# Patient Record
Sex: Female | Born: 1945 | Race: White | Hispanic: No | Marital: Married | Smoking: Former smoker
Health system: Southern US, Community
[De-identification: ages and names within clinical notes are randomized; demographics above are authoritative.]

## PROBLEM LIST (undated history)

## (undated) DIAGNOSIS — I471 Supraventricular tachycardia, unspecified: Secondary | ICD-10-CM

## (undated) DIAGNOSIS — I10 Essential (primary) hypertension: Secondary | ICD-10-CM

## (undated) DIAGNOSIS — E119 Type 2 diabetes mellitus without complications: Secondary | ICD-10-CM

## (undated) HISTORY — PX: ABDOMINAL HYSTERECTOMY: SHX81

---

## 2003-06-01 ENCOUNTER — Encounter: Admission: RE | Admit: 2003-06-01 | Discharge: 2003-06-01 | Payer: Self-pay | Admitting: Gynecology

## 2003-07-07 ENCOUNTER — Emergency Department (HOSPITAL_COMMUNITY): Admission: EM | Admit: 2003-07-07 | Discharge: 2003-07-08 | Payer: Self-pay

## 2004-06-07 ENCOUNTER — Encounter: Admission: RE | Admit: 2004-06-07 | Discharge: 2004-06-07 | Payer: Self-pay | Admitting: Gynecology

## 2004-08-12 ENCOUNTER — Emergency Department (HOSPITAL_COMMUNITY): Admission: EM | Admit: 2004-08-12 | Discharge: 2004-08-13 | Payer: Self-pay | Admitting: Emergency Medicine

## 2005-04-15 ENCOUNTER — Encounter: Admission: RE | Admit: 2005-04-15 | Discharge: 2005-07-14 | Payer: Self-pay | Admitting: Family Medicine

## 2009-10-26 ENCOUNTER — Encounter: Admission: RE | Admit: 2009-10-26 | Discharge: 2009-10-26 | Payer: Self-pay

## 2010-10-16 ENCOUNTER — Other Ambulatory Visit: Payer: Self-pay | Admitting: Family Medicine

## 2010-10-16 DIAGNOSIS — Z1231 Encounter for screening mammogram for malignant neoplasm of breast: Secondary | ICD-10-CM

## 2010-11-26 ENCOUNTER — Ambulatory Visit
Admission: RE | Admit: 2010-11-26 | Discharge: 2010-11-26 | Disposition: A | Discharge status: Home / Self Care | Payer: Medicare Other | Source: Ambulatory Visit | Attending: Family Medicine | Admitting: Family Medicine

## 2010-11-26 DIAGNOSIS — Z1231 Encounter for screening mammogram for malignant neoplasm of breast: Secondary | ICD-10-CM

## 2011-08-09 ENCOUNTER — Emergency Department (HOSPITAL_COMMUNITY)
Admission: EM | Admit: 2011-08-09 | Discharge: 2011-08-09 | Disposition: A | Discharge status: Home / Self Care | Payer: Medicare Other | Attending: Emergency Medicine | Admitting: Emergency Medicine

## 2011-08-09 ENCOUNTER — Other Ambulatory Visit: Payer: Self-pay

## 2011-08-09 DIAGNOSIS — I10 Essential (primary) hypertension: Secondary | ICD-10-CM | POA: Insufficient documentation

## 2011-08-09 DIAGNOSIS — R000 Tachycardia, unspecified: Secondary | ICD-10-CM | POA: Diagnosis not present

## 2011-08-09 DIAGNOSIS — R6889 Other general symptoms and signs: Secondary | ICD-10-CM | POA: Diagnosis not present

## 2011-08-09 DIAGNOSIS — R51 Headache: Secondary | ICD-10-CM | POA: Insufficient documentation

## 2011-08-09 LAB — URINALYSIS, ROUTINE W REFLEX MICROSCOPIC
Bilirubin Urine: NEGATIVE
Glucose, UA: NEGATIVE mg/dL
Ketones, ur: 15 mg/dL — AB
Nitrite: NEGATIVE
Protein, ur: 100 mg/dL — AB
Specific Gravity, Urine: 1.019 (ref 1.005–1.030)
Urobilinogen, UA: 0.2 mg/dL (ref 0.0–1.0)
pH: 6 (ref 5.0–8.0)

## 2011-08-09 LAB — DIFFERENTIAL
Basophils Absolute: 0 10*3/uL (ref 0.0–0.1)
Basophils Relative: 0 % (ref 0–1)
Eosinophils Absolute: 0 10*3/uL (ref 0.0–0.7)
Eosinophils Relative: 0 % (ref 0–5)
Lymphocytes Relative: 16 % (ref 12–46)
Lymphs Abs: 1.8 10*3/uL (ref 0.7–4.0)
Monocytes Absolute: 0.5 10*3/uL (ref 0.1–1.0)
Monocytes Relative: 5 % (ref 3–12)
Neutro Abs: 8.5 10*3/uL — ABNORMAL HIGH (ref 1.7–7.7)
Neutrophils Relative %: 78 % — ABNORMAL HIGH (ref 43–77)

## 2011-08-09 LAB — BASIC METABOLIC PANEL
BUN: 15 mg/dL (ref 6–23)
CO2: 28 mEq/L (ref 19–32)
Calcium: 10 mg/dL (ref 8.4–10.5)
Chloride: 98 mEq/L (ref 96–112)
Creatinine, Ser: 0.56 mg/dL (ref 0.50–1.10)
GFR calc Af Amer: >90 mL/min (ref >90)
GFR calc non Af Amer: >90 mL/min (ref >90)
Glucose, Bld: 146 mg/dL — ABNORMAL HIGH (ref 70–99)
Potassium: 4.4 mEq/L (ref 3.5–5.1)
Sodium: 136 mEq/L (ref 135–145)

## 2011-08-09 LAB — CBC
HCT: 45.5 % (ref 36.0–46.0)
Hemoglobin: 15.7 g/dL — ABNORMAL HIGH (ref 12.0–15.0)
MCH: 31.5 pg (ref 26.0–34.0)
MCHC: 34.5 g/dL (ref 30.0–36.0)
MCV: 91.2 fL (ref 78.0–100.0)
Platelets: 243 10*3/uL (ref 150–400)
RBC: 4.99 MIL/uL (ref 3.87–5.11)
RDW: 13.4 % (ref 11.5–15.5)
WBC: 10.9 10*3/uL — ABNORMAL HIGH (ref 4.0–10.5)

## 2011-08-09 LAB — GLUCOSE, CAPILLARY: Glucose-Capillary: 177 mg/dL — ABNORMAL HIGH (ref 70–99)

## 2011-08-09 LAB — URINE MICROSCOPIC-ADD ON

## 2011-08-09 MED ORDER — SODIUM CHLORIDE 0.9 % IV BOLUS (SEPSIS)
1000.0000 mL | Freq: Once | INTRAVENOUS | Status: AC
  Administered 2011-08-09: 1000 mL via INTRAVENOUS

## 2011-08-09 NOTE — ED Notes (Signed)
Per ems pt is from home/ from dr office. Pt alerted and oriented x4, ambulatory. ems reports pt went to dr office because of high blood pressure, pt took an extra 12.5 metoprolol earlier today. Prescribed 50 mg metoprolol daily. At present pt has taken 37.5 mg metoprolol. Pt also takes htcz. Dr gave pt clonodine and then called ems to transport pt to ED.

## 2011-08-09 NOTE — ED Notes (Signed)
Pt needed to urinate. Denies dizziness/ syncope. Pt ambulated to bathroom. Provided urine specimen cup.

## 2011-08-09 NOTE — ED Provider Notes (Signed)
66 year old female had a headache last visit she took her blood pressure and it was high. She took her blood pressure again today and continue to be high. She went to see her doctor who noted a blood pressure 240/140 and gave her a dose of clonidine and sent her to the emergency department. Her headache is now resolved. Blood pressures in the emergency department have been elevated but not critically elevated. Workup does show proteinuria and that is the only sign of any potential end organ effects from her blood pressure. She is taking HCTZ, but does not take it on a daily basis. At this point, I feel that she should go on HCTZ daily. She's instructed to take it at a time when she is free to go to the bathroom frequently for the next several hours, and advised to stay on a salt restricted diet. She will need to follow up with her PCP and she may need additional medication for control of blood pressure.  Dione Booze, MD 08/09/11 2225

## 2011-08-09 NOTE — ED Provider Notes (Signed)
History     CSN: [id number]  Arrival date & time 08/09/11  1191   First MD Initiated Contact with Patient 08/09/11 2005      Chief Complaint  Patient presents with  . Hypertension  . Headache    (Consider location/radiation/quality/duration/timing/severity/associated sxs/prior treatment) HPI Patient has emergency department following this at her doctor's office for elevated blood pressure and a mild headache today.  She states that she did not have any chest pain, shortness of breath, weakness, vomiting, nausea, abdominal pain, or visual changes.  Patient states that she is normally well managed on her current medication regimen.  She states that she has had tachycardia in the past and high cholesterol are her only other medical problems.  She states that her blood pressure was 240 systolically.  She states that this time she is feeling better without any symptoms. No past medical history on file.  No past surgical history on file.  No family history on file.  History  Substance Use Topics  . Smoking status: Not on file  . Smokeless tobacco: Not on file  . Alcohol Use: Not on file    OB History    No data available      Review of Systems All pertinent positives and negatives reviewed in the history of present illness  Allergies  Review of patient's allergies indicates no known allergies.  Home Medications   Current Outpatient Rx  Name Route Sig Dispense Refill  . ASPIRIN 81 MG PO CHEW Oral Chew 81 mg by mouth daily.    Marland Kitchen CALCIUM CARBONATE-VITAMIN D 500-200 MG-UNIT PO TABS Oral Take 1 tablet by mouth daily.    . CO Q 10 PO Oral Take 1 tablet by mouth daily.    . CO Q 10 10 MG PO CAPS Oral Take by mouth.    Marland Kitchen HYDROCHLOROTHIAZIDE 12.5 MG PO CAPS Oral Take 12.5 mg by mouth daily.    Marland Kitchen METOPROLOL SUCCINATE ER 25 MG PO TB24 Oral Take 50 mg by mouth 2 (two) times daily. Take 2 tablets twice daily    . POLYETHYL GLYCOL-PROPYL GLYCOL 0.4-0.3 % OP SOLN Both Eyes Place 2 drops  into both eyes.      BP 155/88  Pulse 84  Temp(Src) 98.1 F (36.7 C) (Oral)  Resp 20  SpO2 96%  Physical Exam  Constitutional: She is oriented to person, place, and time. She appears well-developed and well-nourished. No distress.  HENT:  Head: Normocephalic and atraumatic.  Mouth/Throat: Oropharynx is clear and moist. No oropharyngeal exudate.  Eyes: EOM are normal. Pupils are equal, round, and reactive to light.  Neck: Normal range of motion. Neck supple.  Cardiovascular: Normal rate, regular rhythm and normal heart sounds.  Exam reveals no gallop and no friction rub.   No murmur heard. Pulmonary/Chest: Effort normal and breath sounds normal. No respiratory distress. She has no wheezes. She has no rales.  Abdominal: Soft. Bowel sounds are normal. She exhibits no distension. There is no tenderness. There is no rebound and no guarding.  Neurological: She is alert and oriented to person, place, and time. No cranial nerve deficit. She exhibits normal muscle tone. Coordination normal.  Skin: Skin is warm and dry. No rash noted.  Psychiatric: She has a normal mood and affect. Her behavior is normal.    ED Course  Procedures (including critical care time)  Labs Reviewed  GLUCOSE, CAPILLARY - Abnormal; Notable for the following:    Glucose-Capillary 177 (*)    All other components  within normal limits  URINALYSIS, ROUTINE W REFLEX MICROSCOPIC - Abnormal; Notable for the following:    Hgb urine dipstick TRACE (*)    Ketones, ur 15 (*)    Protein, ur 100 (*)    Leukocytes, UA SMALL (*)    All other components within normal limits  URINE MICROSCOPIC-ADD ON - Abnormal; Notable for the following:    Squamous Epithelial / LPF FEW (*)    Bacteria, UA MANY (*)    All other components within normal limits  BASIC METABOLIC PANEL  CBC  DIFFERENTIAL  URINE CULTURE   Patient is feeling improved on my examination.  Will check basic laboratory testing the patient monitored further here.   Pressure 10 is in the room was noted the 150s systolically over 92.  The patient is nondistressed is showing no signs of hypertensive emergency.        MDM  MDM Reviewed: nursing note and vitals Interpretation: labs and ECG            Carlyle Dolly, PA-C 08/09/11 2246

## 2011-08-09 NOTE — ED Notes (Signed)
ONG:[id number]<WU> Expected date:08/09/11<BR> Expected time: 6:13 PM<BR> Means of arrival:Ambulance<BR> Comments:<BR> EMS 10 GC - headache/htn

## 2011-08-10 NOTE — ED Provider Notes (Signed)
Medical screening examination/treatment/procedure(s) were conducted as a shared visit with non-physician practitioner(s) and myself.  I personally evaluated the patient during the encounter   Chelcy Bolda, MD 08/10/11 0131 

## 2011-08-11 LAB — URINE CULTURE
Colony Count: NO GROWTH
Culture  Setup Time: 201302020148
Culture: NO GROWTH

## 2011-08-13 DIAGNOSIS — I10 Essential (primary) hypertension: Secondary | ICD-10-CM | POA: Diagnosis not present

## 2011-09-10 DIAGNOSIS — Z79899 Other long term (current) drug therapy: Secondary | ICD-10-CM | POA: Diagnosis not present

## 2011-09-10 DIAGNOSIS — I10 Essential (primary) hypertension: Secondary | ICD-10-CM | POA: Diagnosis not present

## 2011-09-10 DIAGNOSIS — H6690 Otitis media, unspecified, unspecified ear: Secondary | ICD-10-CM | POA: Diagnosis not present

## 2011-09-10 DIAGNOSIS — J069 Acute upper respiratory infection, unspecified: Secondary | ICD-10-CM | POA: Diagnosis not present

## 2011-09-24 DIAGNOSIS — I10 Essential (primary) hypertension: Secondary | ICD-10-CM | POA: Diagnosis not present

## 2011-09-24 DIAGNOSIS — F411 Generalized anxiety disorder: Secondary | ICD-10-CM | POA: Diagnosis not present

## 2011-09-24 DIAGNOSIS — Z79899 Other long term (current) drug therapy: Secondary | ICD-10-CM | POA: Diagnosis not present

## 2011-10-07 DIAGNOSIS — R319 Hematuria, unspecified: Secondary | ICD-10-CM | POA: Diagnosis not present

## 2011-10-07 DIAGNOSIS — I10 Essential (primary) hypertension: Secondary | ICD-10-CM | POA: Diagnosis not present

## 2011-10-07 DIAGNOSIS — Z79899 Other long term (current) drug therapy: Secondary | ICD-10-CM | POA: Diagnosis not present

## 2011-12-19 ENCOUNTER — Other Ambulatory Visit: Payer: Self-pay | Admitting: Family Medicine

## 2011-12-19 DIAGNOSIS — Z1231 Encounter for screening mammogram for malignant neoplasm of breast: Secondary | ICD-10-CM

## 2011-12-27 ENCOUNTER — Ambulatory Visit
Admission: RE | Admit: 2011-12-27 | Discharge: 2011-12-27 | Disposition: A | Discharge status: Home / Self Care | Payer: No Typology Code available for payment source | Source: Ambulatory Visit | Attending: Family Medicine | Admitting: Family Medicine

## 2011-12-27 DIAGNOSIS — Z1231 Encounter for screening mammogram for malignant neoplasm of breast: Secondary | ICD-10-CM | POA: Diagnosis not present

## 2013-03-29 DIAGNOSIS — H2510 Age-related nuclear cataract, unspecified eye: Secondary | ICD-10-CM | POA: Diagnosis not present

## 2013-05-05 DIAGNOSIS — H2510 Age-related nuclear cataract, unspecified eye: Secondary | ICD-10-CM | POA: Diagnosis not present

## 2014-08-11 DIAGNOSIS — I10 Essential (primary) hypertension: Secondary | ICD-10-CM | POA: Diagnosis not present

## 2014-08-11 DIAGNOSIS — G43909 Migraine, unspecified, not intractable, without status migrainosus: Secondary | ICD-10-CM | POA: Diagnosis not present

## 2014-08-11 DIAGNOSIS — E119 Type 2 diabetes mellitus without complications: Secondary | ICD-10-CM | POA: Diagnosis not present

## 2014-08-11 DIAGNOSIS — R5383 Other fatigue: Secondary | ICD-10-CM | POA: Diagnosis not present

## 2014-08-18 DIAGNOSIS — R000 Tachycardia, unspecified: Secondary | ICD-10-CM | POA: Diagnosis not present

## 2014-08-18 DIAGNOSIS — R5383 Other fatigue: Secondary | ICD-10-CM | POA: Diagnosis not present

## 2014-08-18 DIAGNOSIS — I10 Essential (primary) hypertension: Secondary | ICD-10-CM | POA: Diagnosis not present

## 2014-08-18 DIAGNOSIS — I8393 Asymptomatic varicose veins of bilateral lower extremities: Secondary | ICD-10-CM | POA: Diagnosis not present

## 2014-09-05 DIAGNOSIS — I10 Essential (primary) hypertension: Secondary | ICD-10-CM | POA: Diagnosis not present

## 2014-09-05 DIAGNOSIS — R000 Tachycardia, unspecified: Secondary | ICD-10-CM | POA: Diagnosis not present

## 2014-09-05 DIAGNOSIS — E119 Type 2 diabetes mellitus without complications: Secondary | ICD-10-CM | POA: Diagnosis not present

## 2014-09-05 DIAGNOSIS — R5383 Other fatigue: Secondary | ICD-10-CM | POA: Diagnosis not present

## 2014-09-26 ENCOUNTER — Other Ambulatory Visit: Payer: Self-pay

## 2014-09-26 DIAGNOSIS — Z1231 Encounter for screening mammogram for malignant neoplasm of breast: Secondary | ICD-10-CM

## 2014-09-27 DIAGNOSIS — E119 Type 2 diabetes mellitus without complications: Secondary | ICD-10-CM | POA: Diagnosis not present

## 2014-09-27 DIAGNOSIS — I10 Essential (primary) hypertension: Secondary | ICD-10-CM | POA: Diagnosis not present

## 2014-09-27 DIAGNOSIS — Z136 Encounter for screening for cardiovascular disorders: Secondary | ICD-10-CM | POA: Diagnosis not present

## 2014-09-27 DIAGNOSIS — R000 Tachycardia, unspecified: Secondary | ICD-10-CM | POA: Diagnosis not present

## 2014-10-05 ENCOUNTER — Ambulatory Visit
Admission: RE | Admit: 2014-10-05 | Discharge: 2014-10-05 | Disposition: A | Discharge status: Home / Self Care | Payer: Medicare Other | Source: Ambulatory Visit

## 2014-10-05 DIAGNOSIS — Z1231 Encounter for screening mammogram for malignant neoplasm of breast: Secondary | ICD-10-CM | POA: Diagnosis not present

## 2014-10-19 DIAGNOSIS — E119 Type 2 diabetes mellitus without complications: Secondary | ICD-10-CM | POA: Diagnosis not present

## 2014-10-19 DIAGNOSIS — G43909 Migraine, unspecified, not intractable, without status migrainosus: Secondary | ICD-10-CM | POA: Diagnosis not present

## 2014-10-19 DIAGNOSIS — I10 Essential (primary) hypertension: Secondary | ICD-10-CM | POA: Diagnosis not present

## 2014-11-23 DIAGNOSIS — E119 Type 2 diabetes mellitus without complications: Secondary | ICD-10-CM | POA: Diagnosis not present

## 2014-11-23 DIAGNOSIS — I10 Essential (primary) hypertension: Secondary | ICD-10-CM | POA: Diagnosis not present

## 2014-11-23 DIAGNOSIS — G43909 Migraine, unspecified, not intractable, without status migrainosus: Secondary | ICD-10-CM | POA: Diagnosis not present

## 2014-11-23 DIAGNOSIS — R000 Tachycardia, unspecified: Secondary | ICD-10-CM | POA: Diagnosis not present

## 2014-12-20 DIAGNOSIS — H2513 Age-related nuclear cataract, bilateral: Secondary | ICD-10-CM | POA: Diagnosis not present

## 2014-12-20 DIAGNOSIS — E119 Type 2 diabetes mellitus without complications: Secondary | ICD-10-CM | POA: Diagnosis not present

## 2015-01-11 DIAGNOSIS — R000 Tachycardia, unspecified: Secondary | ICD-10-CM | POA: Diagnosis not present

## 2015-01-11 DIAGNOSIS — E119 Type 2 diabetes mellitus without complications: Secondary | ICD-10-CM | POA: Diagnosis not present

## 2015-01-11 DIAGNOSIS — I10 Essential (primary) hypertension: Secondary | ICD-10-CM | POA: Diagnosis not present

## 2015-03-09 DIAGNOSIS — I10 Essential (primary) hypertension: Secondary | ICD-10-CM | POA: Diagnosis not present

## 2015-03-09 DIAGNOSIS — R000 Tachycardia, unspecified: Secondary | ICD-10-CM | POA: Diagnosis not present

## 2015-03-09 DIAGNOSIS — E663 Overweight: Secondary | ICD-10-CM | POA: Diagnosis not present

## 2015-03-09 DIAGNOSIS — E119 Type 2 diabetes mellitus without complications: Secondary | ICD-10-CM | POA: Diagnosis not present

## 2015-06-08 DIAGNOSIS — J309 Allergic rhinitis, unspecified: Secondary | ICD-10-CM | POA: Diagnosis not present

## 2015-06-08 DIAGNOSIS — E119 Type 2 diabetes mellitus without complications: Secondary | ICD-10-CM | POA: Diagnosis not present

## 2015-06-08 DIAGNOSIS — I10 Essential (primary) hypertension: Secondary | ICD-10-CM | POA: Diagnosis not present

## 2015-06-08 DIAGNOSIS — H6122 Impacted cerumen, left ear: Secondary | ICD-10-CM | POA: Diagnosis not present

## 2015-08-28 ENCOUNTER — Encounter (HOSPITAL_COMMUNITY): Payer: Self-pay | Admitting: Emergency Medicine

## 2015-08-28 ENCOUNTER — Emergency Department (HOSPITAL_COMMUNITY)
Admission: EM | Admit: 2015-08-28 | Discharge: 2015-08-28 | Disposition: A | Discharge status: Home / Self Care | Payer: Medicare Other | Attending: Emergency Medicine | Admitting: Emergency Medicine

## 2015-08-28 ENCOUNTER — Emergency Department (HOSPITAL_COMMUNITY): Payer: Medicare Other

## 2015-08-28 DIAGNOSIS — R000 Tachycardia, unspecified: Secondary | ICD-10-CM | POA: Diagnosis not present

## 2015-08-28 DIAGNOSIS — I471 Supraventricular tachycardia: Secondary | ICD-10-CM | POA: Diagnosis not present

## 2015-08-28 DIAGNOSIS — Z79899 Other long term (current) drug therapy: Secondary | ICD-10-CM | POA: Diagnosis not present

## 2015-08-28 DIAGNOSIS — E119 Type 2 diabetes mellitus without complications: Secondary | ICD-10-CM | POA: Insufficient documentation

## 2015-08-28 DIAGNOSIS — Z7982 Long term (current) use of aspirin: Secondary | ICD-10-CM | POA: Diagnosis not present

## 2015-08-28 DIAGNOSIS — Z87891 Personal history of nicotine dependence: Secondary | ICD-10-CM | POA: Diagnosis not present

## 2015-08-28 DIAGNOSIS — I10 Essential (primary) hypertension: Secondary | ICD-10-CM | POA: Diagnosis not present

## 2015-08-28 DIAGNOSIS — Z7984 Long term (current) use of oral hypoglycemic drugs: Secondary | ICD-10-CM | POA: Diagnosis not present

## 2015-08-28 DIAGNOSIS — R079 Chest pain, unspecified: Secondary | ICD-10-CM | POA: Diagnosis not present

## 2015-08-28 DIAGNOSIS — J101 Influenza due to other identified influenza virus with other respiratory manifestations: Secondary | ICD-10-CM | POA: Diagnosis not present

## 2015-08-28 DIAGNOSIS — R05 Cough: Secondary | ICD-10-CM | POA: Diagnosis not present

## 2015-08-28 HISTORY — DX: Type 2 diabetes mellitus without complications: E11.9

## 2015-08-28 HISTORY — DX: Supraventricular tachycardia, unspecified: I47.10

## 2015-08-28 HISTORY — DX: Supraventricular tachycardia: I47.1

## 2015-08-28 HISTORY — DX: Essential (primary) hypertension: I10

## 2015-08-28 LAB — BASIC METABOLIC PANEL
ANION GAP: 10 (ref 5–15)
BUN: 10 mg/dL (ref 6–20)
CO2: 24 mmol/L (ref 22–32)
Calcium: 8.7 mg/dL — ABNORMAL LOW (ref 8.9–10.3)
Chloride: 104 mmol/L (ref 101–111)
Creatinine, Ser: 0.76 mg/dL (ref 0.44–1.00)
Glucose, Bld: 200 mg/dL — ABNORMAL HIGH (ref 65–99)
POTASSIUM: 3.6 mmol/L (ref 3.5–5.1)
SODIUM: 138 mmol/L (ref 135–145)

## 2015-08-28 LAB — CBC WITH DIFFERENTIAL/PLATELET
BASOS ABS: 0 10*3/uL (ref 0.0–0.1)
BASOS PCT: 0 %
Eosinophils Absolute: 0 10*3/uL (ref 0.0–0.7)
Eosinophils Relative: 0 %
HEMATOCRIT: 36.1 % (ref 36.0–46.0)
HEMOGLOBIN: 11.6 g/dL — AB (ref 12.0–15.0)
Lymphocytes Relative: 10 %
Lymphs Abs: 0.7 10*3/uL (ref 0.7–4.0)
MCH: 30.1 pg (ref 26.0–34.0)
MCHC: 32.1 g/dL (ref 30.0–36.0)
MCV: 93.8 fL (ref 78.0–100.0)
MONO ABS: 1 10*3/uL (ref 0.1–1.0)
Monocytes Relative: 15 %
NEUTROS ABS: 5.1 10*3/uL (ref 1.7–7.7)
NEUTROS PCT: 75 %
Platelets: 177 10*3/uL (ref 150–400)
RBC: 3.85 MIL/uL — ABNORMAL LOW (ref 3.87–5.11)
RDW: 13.7 % (ref 11.5–15.5)
WBC: 6.8 10*3/uL (ref 4.0–10.5)

## 2015-08-28 LAB — MAGNESIUM: MAGNESIUM: 1.9 mg/dL (ref 1.7–2.4)

## 2015-08-28 MED ORDER — SODIUM CHLORIDE 0.9 % IV BOLUS (SEPSIS)
1000.0000 mL | Freq: Once | INTRAVENOUS | Status: AC
  Administered 2015-08-28: 1000 mL via INTRAVENOUS

## 2015-08-28 NOTE — ED Notes (Signed)
From home via [facility], CP/SOB one hour pta, SVT on EMS arrival, converted with 6mg  [place], NSR on arrival with no CP/SOB  Flu positive per PCP yesterday

## 2015-08-28 NOTE — Discharge Instructions (Signed)
Paroxysmal Supraventricular Tachycardia Paroxysmal supraventricular tachycardia (PSVT) is a type of abnormal heart rhythm. It causes your heart to beat very quickly and then suddenly stop beating so quickly. A normal heart rate is 60-100 beats per minute. During an episode of PSVT, your heart rate may be 150-250 beats per minute. This can make you feel light-headed and short of breath. An episode of PSVT can be frightening. It is usually not dangerous. The heart has four chambers. All chambers need to work together for the heart to beat effectively. A normal heartbeat usually starts in the right upper chamber of the heart (atrium) when an area (sinoatrial node) puts out an electrical signal that spreads to the other chambers. People with PSVT may have abnormal electrical pathways, or they may have other areas in the upper chambers that send out electrical signals. The result is a very rapid heartbeat. When your heart beats very quickly, it does not have time to fill completely with blood. When PSVT happens often or it lasts for long periods, it can lead to heart weakness and failure. Most people with PSVT do not have any other heart disease. CAUSES Abnormal electrical activity in the heart causes PSVT. It is not known why some people get PSVT and others do not. RISK FACTORS You may be more likely to have PSVT if:  You are 20-30 years old.  You are a woman. Other factors that may increase your chances of an attack include:  Stress.  Being tired.  Smoking.  Stimulant drugs.  Alcoholic drinks.  Caffeine.  Pregnancy. SIGNS AND SYMPTOMS A mild episode of PSVT may cause no symptoms. If you do have signs and symptoms, they may include:  A pounding heart.  Feeling of skipped heartbeats (palpitations).  Weakness.  Shortness of breath.  Tightness or pain in your chest.  Light-headedness.  Anxiety.  Dizziness.  Sweating.  Nausea.  A fainting spell. DIAGNOSIS Your health care  provider may suspect PSVT if you have symptoms that come and go. The health care provider will do a physical exam. If you are having an episode during the exam, the health care provider may be able to diagnose PSVT by listening to your heart and feeling your pulse. Tests may also be done, including:  An electrical study of your heart (electrocardiogram, or ECG).  A test in which you wear a portable ECG monitor all day (Holter monitor) or for several days (event monitor).  A test that involves taking an image of your heart using sound waves (echocardiogram) to rule out other causes of a fast heart rate. TREATMENT You may not need treatment if episodes of PSVT do not happen often or if they do not cause symptoms. If PSVT episodes do cause symptoms, your health care provider may first suggest trying a self-treatment called vagus nerve stimulation. The vagus nerve extends down from the brain. It regulates certain body functions. Stimulating this nerve can slow down the heart. Your health care provider can teach you ways to do this. You may need to try a few ways to find what works best for you. Options include:  Holding your breath and pushing, as though you are having a bowel movement.  Massaging an area on one side of your neck below your jaw.  Bending forward with your head between your legs.  Bending forward with your head between your legs and coughing.  Massaging your eyeballs with your eyes closed. If vagus nerve stimulation does not work, other treatment options include:    Medicines to prevent an attack.  Being treated in the hospital with medicine or electric shock to stop an attack (cardioversion). This treatment can include:  Getting medicine through an IV line.  Having a small electric shock delivered to your heart. You will be given medicine to make you sleep through this procedure.  If you have frequent episodes with symptoms, you may need a procedure to get rid of the faulty  areas of your heart (radiofrequency ablation) and end the episodes of PSVT. In this procedure:  A long, thin tube (catheter) is passed through one of your veins into your heart.  Energy directed through the catheter eliminates the areas of your heart that are causing abnormal electric stimulation. HOME CARE INSTRUCTIONS  Take medicines only as directed by your health care provider.  Do not use caffeine in any form if caffeine triggers episodes of PSVT. Otherwise, consume caffeine in moderation. This means no more than a few cups of coffee or the equivalent each day.  Do not drink alcohol if alcohol triggers episodes of PSVT. Otherwise, limit alcohol intake to no more than 1 drink per day for nonpregnant women and 2 drinks per day for men. One drink equals 12 ounces of beer, 5 ounces of wine, or 1 ounces of hard liquor.  Do not use any tobacco products, including cigarettes, chewing tobacco, or electronic cigarettes. If you need help quitting, ask your health care provider.  Try to get at least 7 hours of sleep each night.  Find healthy ways to manage stress.  Perform vagus nerve stimulation as directed by your health care provider.  Maintain a healthy weight.  Get some exercise on most days. Ask your health care provider to suggest some good activities for you. SEEK MEDICAL CARE IF:  You are having episodes of PSVT more often, or they are lasting longer.  Vagus nerve stimulation is no longer helping.  You have new symptoms during an episode. SEEK IMMEDIATE MEDICAL CARE IF:  You have chest pain or trouble breathing.  You have an episode of PSVT that has lasted longer than 20 minutes.  You have passed out from an episode of PSVT. These symptoms may represent a serious problem that is an emergency. Do not wait to see if the symptoms will go away. Get medical help right away. Call your local emergency services (911 in the [place] Do not drive yourself to the hospital.   This  information is not intended to replace advice given to you by your health care provider. Make sure you discuss any questions you have with your health care provider.   Document Released: 06/24/2005 Document Revised: 07/15/2014 Document Reviewed: 12/02/2013 Elsevier Interactive Patient Education 2016 [employer]  

## 2015-08-28 NOTE — ED Provider Notes (Signed)
CSN: [id number]     Arrival date & time 08/28/15  2008 History   First MD Initiated Contact with Patient 08/28/15 2049     Chief Complaint  Patient presents with  . Chest Pain    Patient is a 70 y.o. female presenting with general illness. The history is provided by the patient and the EMS personnel. No language interpreter was used.  Illness Quality:  Tachycardia Severity:  Severe Onset quality:  Sudden Duration: 30 minutes. Timing:  Rare Progression:  Resolved Chronicity:  Recurrent Context:  Flu positive Relieved by:  Adenosine Worsened by:  None Ineffective treatments:  None Associated symptoms: chest pain   Associated symptoms: no abdominal pain, no congestion, no cough, no diarrhea, no ear pain, no fever, no headaches, no nausea, no rash, no rhinorrhea, no shortness of breath, no sore throat, no vomiting and no wheezing     Past Medical History  Diagnosis Date  . SVT (supraventricular tachycardia) ([place])   . Diabetes mellitus without complication ([place])   . Hypertension    Past Surgical History  Procedure Laterality Date  . Abdominal hysterectomy     No family history on file. Social History  Substance Use Topics  . Smoking status: Former Research scientist (life sciences)  . Smokeless tobacco: None  . Alcohol Use: No   OB History    No data available     Review of Systems  Constitutional: Negative for fever, chills, activity change and appetite change.  HENT: Negative for congestion, dental problem, ear pain, facial swelling, hearing loss, rhinorrhea, sneezing, sore throat, trouble swallowing and voice change.   Eyes: Negative for photophobia, pain, redness and visual disturbance.  Respiratory: Negative for apnea, cough, chest tightness, shortness of breath, wheezing and stridor.   Cardiovascular: Positive for chest pain and palpitations. Negative for leg swelling.  Gastrointestinal: Negative for nausea, vomiting, abdominal pain, diarrhea, constipation, blood in stool and abdominal  distention.  Endocrine: Negative for polydipsia and polyuria.  Genitourinary: Negative for frequency, hematuria, flank pain, decreased urine volume and difficulty urinating.  Musculoskeletal: Negative for back pain, joint swelling, gait problem, neck pain and neck stiffness.  Skin: Negative for rash and wound.  Allergic/Immunologic: Negative for immunocompromised state.  Neurological: Negative for dizziness, syncope, facial asymmetry, speech difficulty, weakness, light-headedness, numbness and headaches.  Hematological: Negative for adenopathy.  Psychiatric/Behavioral: Negative for suicidal ideas, behavioral problems, confusion, sleep disturbance and agitation. The patient is not nervous/anxious.   All other systems reviewed and are negative.     Allergies  Review of patient's allergies indicates no known allergies.  Home Medications   Prior to Admission medications   Medication Sig Start Date End Date Taking? Authorizing Provider  amLODipine (NORVASC) 5 MG tablet Take 5 mg by mouth daily.   Yes Historical Provider, MD  aspirin 81 MG chewable tablet Chew 81 mg by mouth daily.   Yes Historical Provider, MD  calcium-vitamin D (OSCAL WITH D) 500-200 MG-UNIT per tablet Take 1 tablet by mouth daily.   Yes Historical Provider, MD  Coenzyme Q10 (CO Q 10 PO) Take 1 tablet by mouth daily.   Yes Historical Provider, MD  hydrochlorothiazide (MICROZIDE) 12.5 MG capsule Take 12.5 mg by mouth daily.   Yes Historical Provider, MD  losartan (COZAAR) 100 MG tablet Take 100 mg by mouth daily.   Yes Historical Provider, MD  metFORMIN (GLUCOPHAGE) 500 MG tablet Take 500 mg by mouth 2 (two) times daily with a meal.   Yes Historical Provider, MD  metoprolol succinate (TOPROL-XL) 25 MG  24 hr tablet Take 50 mg by mouth 2 (two) times daily. Take 2 tablets twice daily   Yes Historical Provider, MD   BP 134/82 mmHg  Pulse 91  Temp(Src) 99.1 F (37.3 C) (Oral)  Resp 18  Ht 5' (1.524 m)  Wt 72.576 kg  BMI  31.25 kg/m2  SpO2 97% Physical Exam  Constitutional: She is oriented to person, place, and time. She appears well-developed and well-nourished. No distress.  HENT:  Head: Normocephalic and atraumatic.  Right Ear: External ear normal.  Left Ear: External ear normal.  Eyes: Pupils are equal, round, and reactive to light. Right eye exhibits no discharge. Left eye exhibits no discharge.  Neck: Normal range of motion. No JVD present. No tracheal deviation present.  Cardiovascular: Normal rate, regular rhythm and normal heart sounds.  Exam reveals no friction rub.   No murmur heard. Pulmonary/Chest: Effort normal and breath sounds normal. No stridor. No respiratory distress. She has no wheezes.  Abdominal: Soft. Bowel sounds are normal. She exhibits no distension. There is no rebound and no guarding.  Musculoskeletal: Normal range of motion. She exhibits no edema or tenderness.  Lymphadenopathy:    She has no cervical adenopathy.  Neurological: She is alert and oriented to person, place, and time. No cranial nerve deficit. Coordination normal.  Skin: Skin is warm and dry. No rash noted. No pallor.  Psychiatric: She has a normal mood and affect. Her behavior is normal. Judgment and thought content normal.  Nursing note and vitals reviewed.   ED Course  Procedures (including critical care time) Labs Review Labs Reviewed  BASIC METABOLIC PANEL - Abnormal; Notable for the following:    Glucose, Bld 200 (*)    Calcium 8.7 (*)    All other components within normal limits  CBC WITH DIFFERENTIAL/PLATELET - Abnormal; Notable for the following:    RBC 3.85 (*)    Hemoglobin 11.6 (*)    All other components within normal limits  MAGNESIUM    Imaging Review Dg Chest 2 View  08/28/2015  CLINICAL DATA:  Cough, tachycardia.  Flu-like symptoms. EXAM: CHEST  2 VIEW COMPARISON:  None. FINDINGS: Heart is normal in size, mild tortuosity of the thoracic aorta. The lungs are clear. Pulmonary vasculature  is normal. No consolidation, pleural effusion, or pneumothorax. No acute osseous abnormalities are seen. Chronic change about the right shoulder. IMPRESSION: No acute pulmonary process.  Tortuous thoracic aorta. Electronically Signed   By: Jeb Levering M.D.   On: 08/28/2015 23:08   I have personally reviewed and evaluated these images and lab results as part of my medical decision-making.   EKG Interpretation   Date/Time:  Monday August 28 2015 20:17:38 EST Ventricular Rate:  93 PR Interval:  152 QRS Duration: 105 QT Interval:  361 QTC Calculation: 449 R Axis:   8 Text Interpretation:  Sinus rhythm Anteroseptal infarct, old Confirmed by  Hazle Coca [phone]) on 08/28/2015 9:33:28 PM      MDM   Final diagnoses:  SVT (supraventricular tachycardia) ([place])    She brought via EMS for evaluation tachycardia 200s. She has a history of SVT was found to be in SVT by EMS was given 6 mg adenosine with conversion normal sinus rhythm. Initially had chest pain shortness of breath that resolved following adenosine.  Upon arrival patient afebrile, no acute distress. EKG showed heart rate of 91, NSR.    Patient without chest pain or shortness of breath at this time.  CBC with hemoglobin 11.6, BMP unremarkable,  magnesium normal.  Chest x-ray without acute findings.  I suspect SVT was caused by some dehydration from influenza. She was given 1 L normal saline bolus while in the emergency department and had no return of symptoms. Patient okay for discharge home with regular PCP follow-up.  Discussed case with my attending Dr. Ralene Bathe.    Vira Blanco, MD 08/28/15 2317  Quintella Reichert, MD 08/29/15 0001

## 2015-09-06 DIAGNOSIS — E119 Type 2 diabetes mellitus without complications: Secondary | ICD-10-CM | POA: Diagnosis not present

## 2015-09-06 DIAGNOSIS — I10 Essential (primary) hypertension: Secondary | ICD-10-CM | POA: Diagnosis not present

## 2015-09-06 DIAGNOSIS — R000 Tachycardia, unspecified: Secondary | ICD-10-CM | POA: Diagnosis not present

## 2015-10-25 ENCOUNTER — Other Ambulatory Visit: Payer: Self-pay

## 2015-10-25 DIAGNOSIS — Z1231 Encounter for screening mammogram for malignant neoplasm of breast: Secondary | ICD-10-CM

## 2015-11-07 ENCOUNTER — Ambulatory Visit
Admission: RE | Admit: 2015-11-07 | Discharge: 2015-11-07 | Disposition: A | Discharge status: Home / Self Care | Payer: Medicare Other | Source: Ambulatory Visit

## 2015-11-07 DIAGNOSIS — Z1231 Encounter for screening mammogram for malignant neoplasm of breast: Secondary | ICD-10-CM | POA: Diagnosis not present

## 2015-11-07 DIAGNOSIS — I10 Essential (primary) hypertension: Secondary | ICD-10-CM | POA: Diagnosis not present

## 2015-11-07 DIAGNOSIS — E119 Type 2 diabetes mellitus without complications: Secondary | ICD-10-CM | POA: Diagnosis not present

## 2015-11-07 DIAGNOSIS — R000 Tachycardia, unspecified: Secondary | ICD-10-CM | POA: Diagnosis not present

## 2015-12-21 DIAGNOSIS — E119 Type 2 diabetes mellitus without complications: Secondary | ICD-10-CM | POA: Diagnosis not present

## 2016-02-08 DIAGNOSIS — Z136 Encounter for screening for cardiovascular disorders: Secondary | ICD-10-CM | POA: Diagnosis not present

## 2016-02-08 DIAGNOSIS — I10 Essential (primary) hypertension: Secondary | ICD-10-CM | POA: Diagnosis not present

## 2016-02-08 DIAGNOSIS — E119 Type 2 diabetes mellitus without complications: Secondary | ICD-10-CM | POA: Diagnosis not present

## 2016-02-08 DIAGNOSIS — E663 Overweight: Secondary | ICD-10-CM | POA: Diagnosis not present

## 2016-03-01 ENCOUNTER — Encounter (HOSPITAL_COMMUNITY): Payer: Self-pay | Admitting: Emergency Medicine

## 2016-03-01 ENCOUNTER — Emergency Department (HOSPITAL_COMMUNITY)
Admission: EM | Admit: 2016-03-01 | Discharge: 2016-03-01 | Disposition: A | Discharge status: Home / Self Care | Payer: Medicare Other | Attending: Emergency Medicine | Admitting: Emergency Medicine

## 2016-03-01 DIAGNOSIS — Z79891 Long term (current) use of opiate analgesic: Secondary | ICD-10-CM | POA: Insufficient documentation

## 2016-03-01 DIAGNOSIS — I10 Essential (primary) hypertension: Secondary | ICD-10-CM | POA: Insufficient documentation

## 2016-03-01 DIAGNOSIS — R000 Tachycardia, unspecified: Secondary | ICD-10-CM | POA: Insufficient documentation

## 2016-03-01 DIAGNOSIS — W01190A Fall on same level from slipping, tripping and stumbling with subsequent striking against furniture, initial encounter: Secondary | ICD-10-CM | POA: Insufficient documentation

## 2016-03-01 DIAGNOSIS — Y939 Activity, unspecified: Secondary | ICD-10-CM | POA: Insufficient documentation

## 2016-03-01 DIAGNOSIS — E119 Type 2 diabetes mellitus without complications: Secondary | ICD-10-CM | POA: Diagnosis not present

## 2016-03-01 DIAGNOSIS — S01111A Laceration without foreign body of right eyelid and periocular area, initial encounter: Secondary | ICD-10-CM | POA: Diagnosis not present

## 2016-03-01 DIAGNOSIS — Y999 Unspecified external cause status: Secondary | ICD-10-CM | POA: Insufficient documentation

## 2016-03-01 DIAGNOSIS — S0181XA Laceration without foreign body of other part of head, initial encounter: Secondary | ICD-10-CM

## 2016-03-01 DIAGNOSIS — Y929 Unspecified place or not applicable: Secondary | ICD-10-CM | POA: Diagnosis not present

## 2016-03-01 DIAGNOSIS — Z7984 Long term (current) use of oral hypoglycemic drugs: Secondary | ICD-10-CM | POA: Insufficient documentation

## 2016-03-01 DIAGNOSIS — Z87891 Personal history of nicotine dependence: Secondary | ICD-10-CM | POA: Diagnosis not present

## 2016-03-01 DIAGNOSIS — Z79899 Other long term (current) drug therapy: Secondary | ICD-10-CM | POA: Insufficient documentation

## 2016-03-01 MED ORDER — LIDOCAINE HCL 2 % IJ SOLN
INTRAMUSCULAR | Status: AC
  Administered 2016-03-01: 400 mg
  Filled 2016-03-01: qty 20

## 2016-03-01 MED ORDER — BACITRACIN ZINC 500 UNIT/GM EX OINT
TOPICAL_OINTMENT | CUTANEOUS | Status: AC
  Administered 2016-03-01: 18:00:00
  Filled 2016-03-01: qty 0.9

## 2016-03-01 NOTE — ED Triage Notes (Addendum)
Pt tripped this afternoon and cut R forehead above eyebrow on end table. Pt also has abrasion on bridge of nose. No LOC, not on blood thinners. Last tetanus unknown.

## 2016-03-01 NOTE — ED Provider Notes (Signed)
[facility] DEPT Provider Note   CSN: [id number] Arrival date & time: 03/01/16  1715     History   Chief Complaint Chief Complaint  Patient presents with  . Head Laceration    HPI Cathy Palmer is a 70 y.o. female.  70 year old female with history of tachycardia presents after a mechanical fall just prior to arrival. [place] the right side of her face. No loss of consciousness. No confusion or vomiting since the incident. Denies any neck pain. No chest or abdominal discomfort. Had moderate bleeding that was controlled with direct pressure. Denies any visual complaints at this time. Does note some sharp pain to the bridge of her nose and did have some superficial bleeding which is since stopped. Does not take any blood thinners at this time.      Past Medical History:  Diagnosis Date  . Diabetes mellitus without complication ([place])   . Hypertension   . SVT (supraventricular tachycardia) ([facility])     There are no active problems to display for this patient.   Past Surgical History:  Procedure Laterality Date  . ABDOMINAL HYSTERECTOMY      OB History    No data available       Home Medications    Prior to Admission medications   Medication Sig Start Date End Date Taking? Authorizing Provider  amLODipine (NORVASC) 5 MG tablet Take 5 mg by mouth daily.    Historical Provider, MD  aspirin 81 MG chewable tablet Chew 81 mg by mouth daily.    Historical Provider, MD  calcium-vitamin D (OSCAL WITH D) 500-200 MG-UNIT per tablet Take 1 tablet by mouth daily.    Historical Provider, MD  Coenzyme Q10 (CO Q 10 PO) Take 1 tablet by mouth daily.    Historical Provider, MD  hydrochlorothiazide (MICROZIDE) 12.5 MG capsule Take 12.5 mg by mouth daily.    Historical Provider, MD  losartan (COZAAR) 100 MG tablet Take 100 mg by mouth daily.    Historical Provider, MD  metFORMIN (GLUCOPHAGE) 500 MG tablet Take 500 mg by mouth 2 (two) times daily with a meal.    Historical Provider,  MD  metoprolol succinate (TOPROL-XL) 25 MG 24 hr tablet Take 50 mg by mouth 2 (two) times daily. Take 2 tablets twice daily    Historical Provider, MD    Family History History reviewed. No pertinent family history.  Social History Social History  Substance Use Topics  . Smoking status: Former Research scientist (life sciences)  . Smokeless tobacco: Not on file  . Alcohol use No     Allergies   Bee venom   Review of Systems Review of Systems  All other systems reviewed and are negative.    Physical Exam Updated Vital Signs BP 168/94 (BP Location: Right Arm)   Pulse 118   Temp 98.1 F (36.7 C) (Oral)   Resp 20   SpO2 94%   Physical Exam  Constitutional: She is oriented to person, place, and time. She appears well-developed and well-nourished.  Non-toxic appearance. No distress.  HENT:  Head: Normocephalic and atraumatic.  Nose:    Eyes: Conjunctivae, EOM and lids are normal. Pupils are equal, round, and reactive to light.    Neck: Normal range of motion. Neck supple. No tracheal deviation present. No thyroid mass present.  Cardiovascular: Regular rhythm and normal heart sounds.  Tachycardia present.  Exam reveals no gallop.   No murmur heard. Pulmonary/Chest: Effort normal and breath sounds normal. No stridor. No respiratory distress. She has no decreased  breath sounds. She has no wheezes. She has no rhonchi. She has no rales.  Abdominal: Soft. Normal appearance and bowel sounds are normal. She exhibits no distension. There is no tenderness. There is no rebound and no CVA tenderness.  Musculoskeletal: Normal range of motion. She exhibits no edema or tenderness.  Neurological: She is alert and oriented to person, place, and time. She has normal strength. No cranial nerve deficit or sensory deficit. GCS eye subscore is 4. GCS verbal subscore is 5. GCS motor subscore is 6.  Skin: Skin is warm and dry. No abrasion and no rash noted.  Psychiatric: She has a normal mood and affect. Her speech is  normal and behavior is normal.  Nursing note and vitals reviewed.    ED Treatments / Results  Labs (all labs ordered are listed, but only abnormal results are displayed) Labs Reviewed - No data to display  EKG  EKG Interpretation None       Radiology No results found.  Procedures Procedures (including critical care time)  Medications Ordered in ED Medications  lidocaine (XYLOCAINE) 2 % (with pres) injection (not administered)  bacitracin 500 UNIT/GM ointment (not administered)     Initial Impression / Assessment and Plan / ED Course  I have reviewed the triage vital signs and the nursing notes.  Pertinent labs & imaging results that were available during my care of the patient were reviewed by me and considered in my medical decision making (see chart for details).  Clinical Course    LACERATION REPAIR Performed by: Leota Jacobsen Authorized by: Leota Jacobsen Consent: Verbal consent obtained. Risks and benefits: risks, benefits and alternatives were discussed Consent given by: patient Patient identity confirmed: provided demographic data Prepped and Draped in normal sterile fashion Wound explored  Laceration Location: Right eyebrow  Laceration Length: 1.5 cm  No Foreign Bodies seen or palpated  Anesthesia: local infiltration  Local anesthetic: lidocaine 1% % plain epinephrine  Anesthetic total: 5 ml  Irrigation method: syringe Amount of cleaning: standard  Skin closure: Simple interrupted   Number of sutures: 4   Technique: Simple interrupted   Patient tolerance: Patient tolerated the procedure well with no immediate complications.  Patient without loss of consciousness, confusion, use of blood thinners patient agreeable to not perform a head CT at this time  Return precautions given Final Clinical Impressions(s) / ED Diagnoses   Final diagnoses:  None   Patient's superficial laceration were reapproximated with Dermabond. New  Prescriptions New Prescriptions   No medications on file     Lacretia Leigh, MD 03/01/16 [phone]

## 2016-03-01 NOTE — Discharge Instructions (Signed)
Have sutures removed in 5 days

## 2016-03-06 DIAGNOSIS — R51 Headache: Secondary | ICD-10-CM | POA: Diagnosis not present

## 2016-03-06 DIAGNOSIS — S0083XD Contusion of other part of head, subsequent encounter: Secondary | ICD-10-CM | POA: Diagnosis not present

## 2016-03-06 DIAGNOSIS — S0990XA Unspecified injury of head, initial encounter: Secondary | ICD-10-CM | POA: Diagnosis not present

## 2016-05-15 DIAGNOSIS — I10 Essential (primary) hypertension: Secondary | ICD-10-CM | POA: Diagnosis not present

## 2016-05-15 DIAGNOSIS — Z6832 Body mass index (BMI) 32.0-32.9, adult: Secondary | ICD-10-CM | POA: Diagnosis not present

## 2016-05-15 DIAGNOSIS — Z23 Encounter for immunization: Secondary | ICD-10-CM | POA: Diagnosis not present

## 2016-05-15 DIAGNOSIS — E119 Type 2 diabetes mellitus without complications: Secondary | ICD-10-CM | POA: Diagnosis not present

## 2016-05-23 DIAGNOSIS — Z6832 Body mass index (BMI) 32.0-32.9, adult: Secondary | ICD-10-CM | POA: Diagnosis not present

## 2016-05-23 DIAGNOSIS — Z0000 Encounter for general adult medical examination without abnormal findings: Secondary | ICD-10-CM | POA: Diagnosis not present

## 2016-05-23 DIAGNOSIS — I10 Essential (primary) hypertension: Secondary | ICD-10-CM | POA: Diagnosis not present

## 2016-05-23 DIAGNOSIS — E119 Type 2 diabetes mellitus without complications: Secondary | ICD-10-CM | POA: Diagnosis not present

## 2016-05-23 DIAGNOSIS — R000 Tachycardia, unspecified: Secondary | ICD-10-CM | POA: Diagnosis not present

## 2016-08-15 DIAGNOSIS — E119 Type 2 diabetes mellitus without complications: Secondary | ICD-10-CM | POA: Diagnosis not present

## 2016-08-15 DIAGNOSIS — Z6831 Body mass index (BMI) 31.0-31.9, adult: Secondary | ICD-10-CM | POA: Diagnosis not present

## 2016-08-15 DIAGNOSIS — I10 Essential (primary) hypertension: Secondary | ICD-10-CM | POA: Diagnosis not present

## 2016-08-15 DIAGNOSIS — K219 Gastro-esophageal reflux disease without esophagitis: Secondary | ICD-10-CM | POA: Diagnosis not present

## 2016-08-16 DIAGNOSIS — K219 Gastro-esophageal reflux disease without esophagitis: Secondary | ICD-10-CM | POA: Diagnosis not present

## 2016-08-29 DIAGNOSIS — Z6831 Body mass index (BMI) 31.0-31.9, adult: Secondary | ICD-10-CM | POA: Diagnosis not present

## 2016-08-29 DIAGNOSIS — K219 Gastro-esophageal reflux disease without esophagitis: Secondary | ICD-10-CM | POA: Diagnosis not present

## 2016-08-29 DIAGNOSIS — R350 Frequency of micturition: Secondary | ICD-10-CM | POA: Diagnosis not present

## 2016-08-29 DIAGNOSIS — E119 Type 2 diabetes mellitus without complications: Secondary | ICD-10-CM | POA: Diagnosis not present

## 2016-08-29 DIAGNOSIS — N390 Urinary tract infection, site not specified: Secondary | ICD-10-CM | POA: Diagnosis not present

## 2016-09-19 DIAGNOSIS — Z6832 Body mass index (BMI) 32.0-32.9, adult: Secondary | ICD-10-CM | POA: Diagnosis not present

## 2016-09-19 DIAGNOSIS — K219 Gastro-esophageal reflux disease without esophagitis: Secondary | ICD-10-CM | POA: Diagnosis not present

## 2016-09-19 DIAGNOSIS — I10 Essential (primary) hypertension: Secondary | ICD-10-CM | POA: Diagnosis not present

## 2016-09-19 DIAGNOSIS — E119 Type 2 diabetes mellitus without complications: Secondary | ICD-10-CM | POA: Diagnosis not present

## 2016-12-24 DIAGNOSIS — E119 Type 2 diabetes mellitus without complications: Secondary | ICD-10-CM | POA: Diagnosis not present

## 2016-12-24 DIAGNOSIS — K219 Gastro-esophageal reflux disease without esophagitis: Secondary | ICD-10-CM | POA: Diagnosis not present

## 2016-12-24 DIAGNOSIS — Z6832 Body mass index (BMI) 32.0-32.9, adult: Secondary | ICD-10-CM | POA: Diagnosis not present

## 2016-12-24 DIAGNOSIS — I10 Essential (primary) hypertension: Secondary | ICD-10-CM | POA: Diagnosis not present

## 2016-12-24 DIAGNOSIS — H524 Presbyopia: Secondary | ICD-10-CM | POA: Diagnosis not present

## 2017-03-25 DIAGNOSIS — E782 Mixed hyperlipidemia: Secondary | ICD-10-CM | POA: Diagnosis not present

## 2017-03-25 DIAGNOSIS — E161 Other hypoglycemia: Secondary | ICD-10-CM | POA: Diagnosis not present

## 2017-04-01 DIAGNOSIS — Z23 Encounter for immunization: Secondary | ICD-10-CM | POA: Diagnosis not present

## 2017-04-01 DIAGNOSIS — I10 Essential (primary) hypertension: Secondary | ICD-10-CM | POA: Diagnosis not present

## 2017-04-01 DIAGNOSIS — Z6832 Body mass index (BMI) 32.0-32.9, adult: Secondary | ICD-10-CM | POA: Diagnosis not present

## 2017-04-01 DIAGNOSIS — E782 Mixed hyperlipidemia: Secondary | ICD-10-CM | POA: Diagnosis not present

## 2017-04-01 DIAGNOSIS — E119 Type 2 diabetes mellitus without complications: Secondary | ICD-10-CM | POA: Diagnosis not present

## 2017-05-26 DIAGNOSIS — I10 Essential (primary) hypertension: Secondary | ICD-10-CM | POA: Diagnosis not present

## 2017-05-26 DIAGNOSIS — K219 Gastro-esophageal reflux disease without esophagitis: Secondary | ICD-10-CM | POA: Diagnosis not present

## 2017-05-26 DIAGNOSIS — R000 Tachycardia, unspecified: Secondary | ICD-10-CM | POA: Diagnosis not present

## 2017-05-26 DIAGNOSIS — Z0000 Encounter for general adult medical examination without abnormal findings: Secondary | ICD-10-CM | POA: Diagnosis not present

## 2017-05-26 DIAGNOSIS — Z6832 Body mass index (BMI) 32.0-32.9, adult: Secondary | ICD-10-CM | POA: Diagnosis not present

## 2017-07-30 DIAGNOSIS — E119 Type 2 diabetes mellitus without complications: Secondary | ICD-10-CM | POA: Diagnosis not present

## 2017-07-30 DIAGNOSIS — R000 Tachycardia, unspecified: Secondary | ICD-10-CM | POA: Diagnosis not present

## 2017-07-30 DIAGNOSIS — Z6831 Body mass index (BMI) 31.0-31.9, adult: Secondary | ICD-10-CM | POA: Diagnosis not present

## 2017-07-30 DIAGNOSIS — I10 Essential (primary) hypertension: Secondary | ICD-10-CM | POA: Diagnosis not present

## 2017-10-19 IMAGING — CR DG CHEST 2V
2 series · 2 of 2 positions shown · non-contrast
Comparison: None.

CLINICAL DATA: Cough, tachycardia.  Flu-like symptoms.

EXAM:
CHEST  2 VIEW

[chest pa]
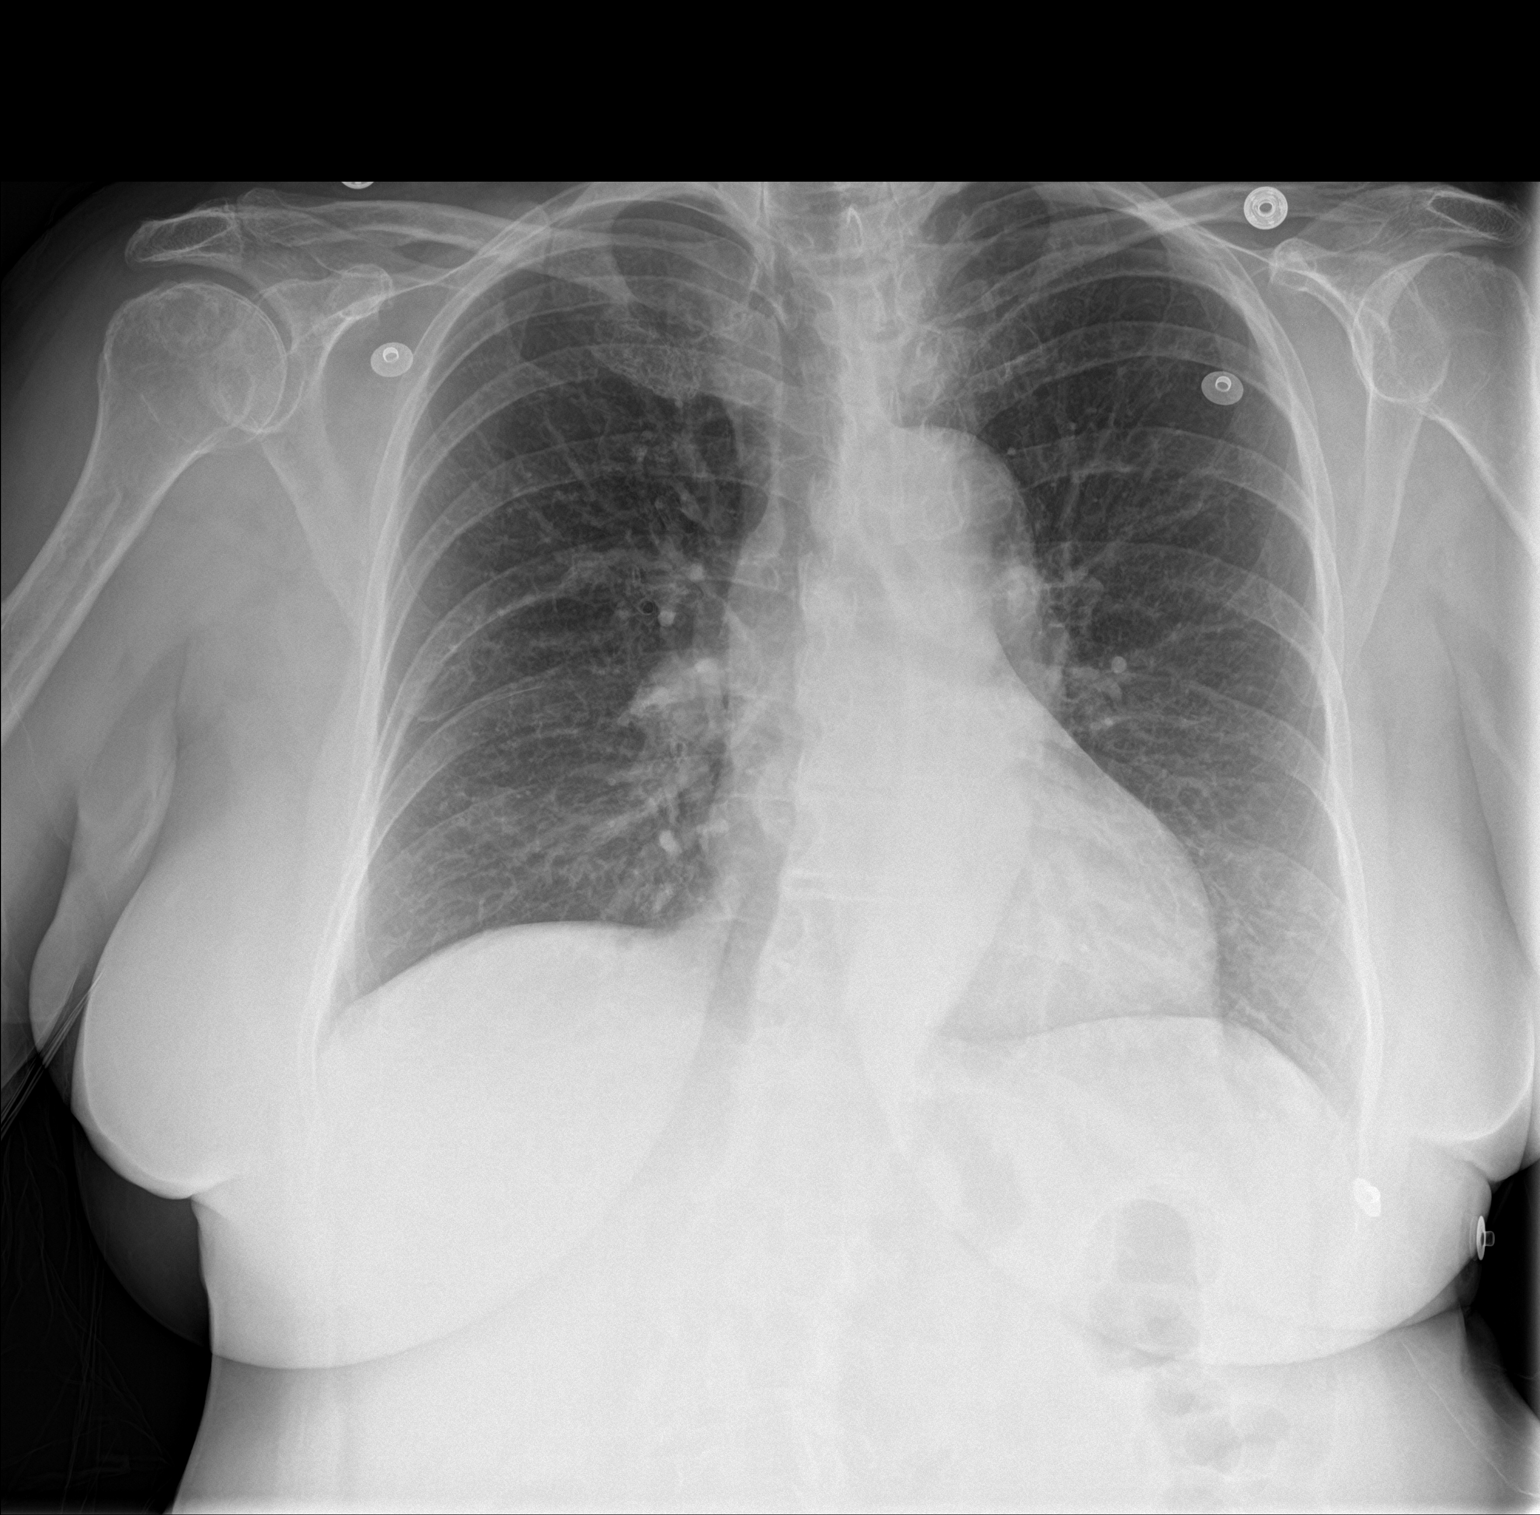

[chest lat]
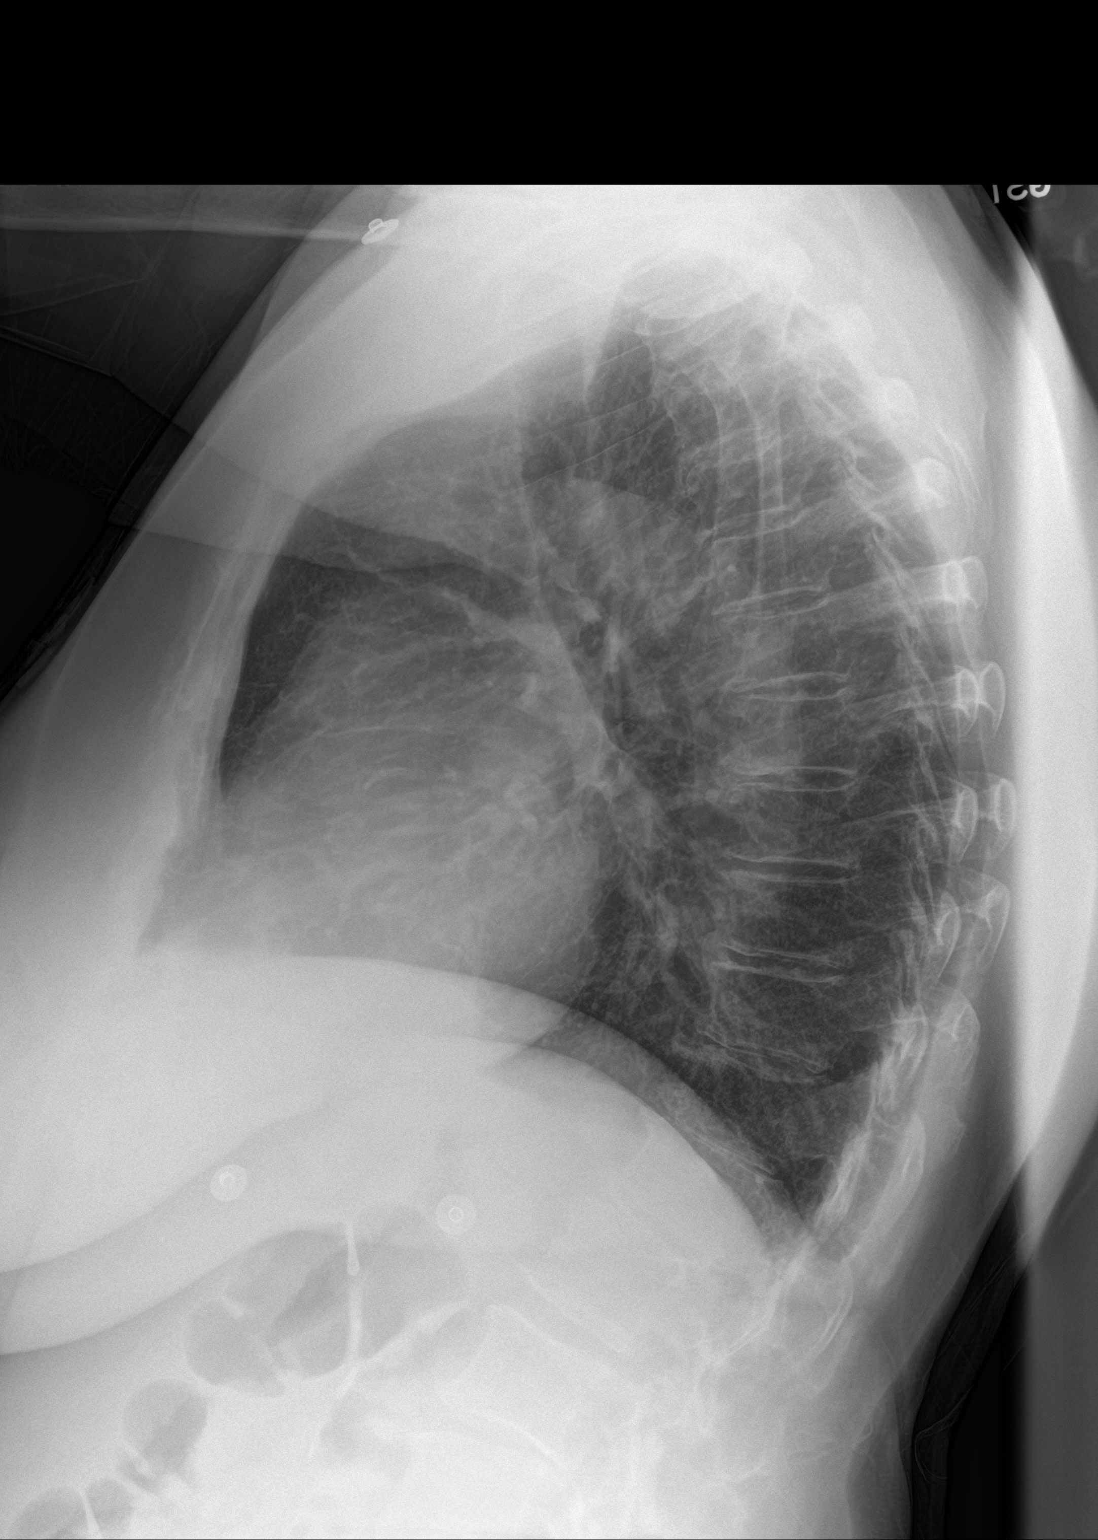

[2 of 2 positions shown; findings below may reference images not displayed]

FINDINGS: Heart is normal in size, mild tortuosity of the thoracic aorta. The
lungs are clear. Pulmonary vasculature is normal. No consolidation,
pleural effusion, or pneumothorax. No acute osseous abnormalities
are seen. Chronic change about the right shoulder.
IMPRESSION: No acute pulmonary process.  Tortuous thoracic aorta.

## 2017-10-28 DIAGNOSIS — E782 Mixed hyperlipidemia: Secondary | ICD-10-CM | POA: Diagnosis not present

## 2017-10-28 DIAGNOSIS — R5383 Other fatigue: Secondary | ICD-10-CM | POA: Diagnosis not present

## 2017-10-28 DIAGNOSIS — I10 Essential (primary) hypertension: Secondary | ICD-10-CM | POA: Diagnosis not present

## 2017-10-28 DIAGNOSIS — E559 Vitamin D deficiency, unspecified: Secondary | ICD-10-CM | POA: Diagnosis not present

## 2017-10-28 DIAGNOSIS — Z79899 Other long term (current) drug therapy: Secondary | ICD-10-CM | POA: Diagnosis not present

## 2017-10-31 DIAGNOSIS — E119 Type 2 diabetes mellitus without complications: Secondary | ICD-10-CM | POA: Diagnosis not present

## 2017-10-31 DIAGNOSIS — Z6832 Body mass index (BMI) 32.0-32.9, adult: Secondary | ICD-10-CM | POA: Diagnosis not present

## 2017-10-31 DIAGNOSIS — E559 Vitamin D deficiency, unspecified: Secondary | ICD-10-CM | POA: Diagnosis not present

## 2017-10-31 DIAGNOSIS — I10 Essential (primary) hypertension: Secondary | ICD-10-CM | POA: Diagnosis not present

## 2018-01-15 DIAGNOSIS — R062 Wheezing: Secondary | ICD-10-CM | POA: Diagnosis not present

## 2018-01-15 DIAGNOSIS — R0602 Shortness of breath: Secondary | ICD-10-CM | POA: Diagnosis not present

## 2018-01-15 DIAGNOSIS — Z6831 Body mass index (BMI) 31.0-31.9, adult: Secondary | ICD-10-CM | POA: Diagnosis not present

## 2018-01-15 DIAGNOSIS — J40 Bronchitis, not specified as acute or chronic: Secondary | ICD-10-CM | POA: Diagnosis not present

## 2018-01-19 DIAGNOSIS — J40 Bronchitis, not specified as acute or chronic: Secondary | ICD-10-CM | POA: Diagnosis not present

## 2018-01-19 DIAGNOSIS — L309 Dermatitis, unspecified: Secondary | ICD-10-CM | POA: Diagnosis not present

## 2018-01-19 DIAGNOSIS — Z6831 Body mass index (BMI) 31.0-31.9, adult: Secondary | ICD-10-CM | POA: Diagnosis not present

## 2018-02-04 DIAGNOSIS — E119 Type 2 diabetes mellitus without complications: Secondary | ICD-10-CM | POA: Diagnosis not present

## 2018-02-04 DIAGNOSIS — E559 Vitamin D deficiency, unspecified: Secondary | ICD-10-CM | POA: Diagnosis not present

## 2018-02-05 DIAGNOSIS — I10 Essential (primary) hypertension: Secondary | ICD-10-CM | POA: Diagnosis not present

## 2018-02-05 DIAGNOSIS — Z6831 Body mass index (BMI) 31.0-31.9, adult: Secondary | ICD-10-CM | POA: Diagnosis not present

## 2018-02-05 DIAGNOSIS — E559 Vitamin D deficiency, unspecified: Secondary | ICD-10-CM | POA: Diagnosis not present

## 2018-02-05 DIAGNOSIS — E119 Type 2 diabetes mellitus without complications: Secondary | ICD-10-CM | POA: Diagnosis not present

## 2018-02-05 DIAGNOSIS — Z23 Encounter for immunization: Secondary | ICD-10-CM | POA: Diagnosis not present

## 2018-05-15 DIAGNOSIS — H2513 Age-related nuclear cataract, bilateral: Secondary | ICD-10-CM | POA: Diagnosis not present

## 2018-05-15 DIAGNOSIS — E119 Type 2 diabetes mellitus without complications: Secondary | ICD-10-CM | POA: Diagnosis not present

## 2018-05-15 DIAGNOSIS — H524 Presbyopia: Secondary | ICD-10-CM | POA: Diagnosis not present

## 2018-05-29 DIAGNOSIS — Z6831 Body mass index (BMI) 31.0-31.9, adult: Secondary | ICD-10-CM | POA: Diagnosis not present

## 2018-05-29 DIAGNOSIS — I10 Essential (primary) hypertension: Secondary | ICD-10-CM | POA: Diagnosis not present

## 2018-05-29 DIAGNOSIS — E119 Type 2 diabetes mellitus without complications: Secondary | ICD-10-CM | POA: Diagnosis not present

## 2018-05-29 DIAGNOSIS — Z0000 Encounter for general adult medical examination without abnormal findings: Secondary | ICD-10-CM | POA: Diagnosis not present

## 2018-05-29 DIAGNOSIS — R000 Tachycardia, unspecified: Secondary | ICD-10-CM | POA: Diagnosis not present

## 2018-05-29 DIAGNOSIS — E559 Vitamin D deficiency, unspecified: Secondary | ICD-10-CM | POA: Diagnosis not present

## 2018-05-29 DIAGNOSIS — Z23 Encounter for immunization: Secondary | ICD-10-CM | POA: Diagnosis not present

## 2018-06-01 ENCOUNTER — Other Ambulatory Visit: Payer: Self-pay | Admitting: Family Medicine

## 2018-06-01 DIAGNOSIS — Z1231 Encounter for screening mammogram for malignant neoplasm of breast: Secondary | ICD-10-CM

## 2018-09-24 DIAGNOSIS — I10 Essential (primary) hypertension: Secondary | ICD-10-CM | POA: Diagnosis not present

## 2018-09-24 DIAGNOSIS — Z719 Counseling, unspecified: Secondary | ICD-10-CM | POA: Diagnosis not present

## 2018-09-24 DIAGNOSIS — E119 Type 2 diabetes mellitus without complications: Secondary | ICD-10-CM | POA: Diagnosis not present

## 2018-09-24 DIAGNOSIS — E782 Mixed hyperlipidemia: Secondary | ICD-10-CM | POA: Diagnosis not present

## 2018-11-19 DIAGNOSIS — T148XXA Other injury of unspecified body region, initial encounter: Secondary | ICD-10-CM | POA: Diagnosis not present

## 2019-01-15 ENCOUNTER — Other Ambulatory Visit: Payer: Medicare Other

## 2019-01-15 ENCOUNTER — Other Ambulatory Visit: Payer: Self-pay

## 2019-01-15 DIAGNOSIS — R6889 Other general symptoms and signs: Secondary | ICD-10-CM | POA: Diagnosis not present

## 2019-01-15 DIAGNOSIS — Z20822 Contact with and (suspected) exposure to covid-19: Secondary | ICD-10-CM

## 2019-01-21 LAB — NOVEL CORONAVIRUS, NAA: SARS-CoV-2, NAA: NOT DETECTED

## 2019-04-07 DIAGNOSIS — Z23 Encounter for immunization: Secondary | ICD-10-CM | POA: Diagnosis not present

## 2019-05-21 DIAGNOSIS — H524 Presbyopia: Secondary | ICD-10-CM | POA: Diagnosis not present

## 2019-05-21 DIAGNOSIS — E119 Type 2 diabetes mellitus without complications: Secondary | ICD-10-CM | POA: Diagnosis not present

## 2019-06-18 DIAGNOSIS — Z6831 Body mass index (BMI) 31.0-31.9, adult: Secondary | ICD-10-CM | POA: Diagnosis not present

## 2019-06-18 DIAGNOSIS — Z0000 Encounter for general adult medical examination without abnormal findings: Secondary | ICD-10-CM | POA: Diagnosis not present

## 2019-07-12 DIAGNOSIS — E559 Vitamin D deficiency, unspecified: Secondary | ICD-10-CM | POA: Diagnosis not present

## 2019-07-12 DIAGNOSIS — E782 Mixed hyperlipidemia: Secondary | ICD-10-CM | POA: Diagnosis not present

## 2019-07-12 DIAGNOSIS — I10 Essential (primary) hypertension: Secondary | ICD-10-CM | POA: Diagnosis not present

## 2019-07-12 DIAGNOSIS — E119 Type 2 diabetes mellitus without complications: Secondary | ICD-10-CM | POA: Diagnosis not present

## 2019-07-16 DIAGNOSIS — Z6830 Body mass index (BMI) 30.0-30.9, adult: Secondary | ICD-10-CM | POA: Diagnosis not present

## 2019-07-16 DIAGNOSIS — E782 Mixed hyperlipidemia: Secondary | ICD-10-CM | POA: Diagnosis not present

## 2019-07-16 DIAGNOSIS — I10 Essential (primary) hypertension: Secondary | ICD-10-CM | POA: Diagnosis not present

## 2019-07-16 DIAGNOSIS — E119 Type 2 diabetes mellitus without complications: Secondary | ICD-10-CM | POA: Diagnosis not present

## 2019-08-20 DIAGNOSIS — Z23 Encounter for immunization: Secondary | ICD-10-CM | POA: Diagnosis not present

## 2019-09-17 DIAGNOSIS — Z23 Encounter for immunization: Secondary | ICD-10-CM | POA: Diagnosis not present

## 2020-03-28 DIAGNOSIS — I10 Essential (primary) hypertension: Secondary | ICD-10-CM | POA: Diagnosis not present

## 2020-03-28 DIAGNOSIS — E1165 Type 2 diabetes mellitus with hyperglycemia: Secondary | ICD-10-CM | POA: Diagnosis not present

## 2020-03-28 DIAGNOSIS — E669 Obesity, unspecified: Secondary | ICD-10-CM | POA: Diagnosis not present

## 2020-03-28 DIAGNOSIS — E559 Vitamin D deficiency, unspecified: Secondary | ICD-10-CM | POA: Diagnosis not present

## 2020-04-04 DIAGNOSIS — Z23 Encounter for immunization: Secondary | ICD-10-CM | POA: Diagnosis not present

## 2020-05-22 DIAGNOSIS — I10 Essential (primary) hypertension: Secondary | ICD-10-CM | POA: Diagnosis not present

## 2020-05-22 DIAGNOSIS — E782 Mixed hyperlipidemia: Secondary | ICD-10-CM | POA: Diagnosis not present

## 2020-05-22 DIAGNOSIS — E559 Vitamin D deficiency, unspecified: Secondary | ICD-10-CM | POA: Diagnosis not present

## 2020-05-22 DIAGNOSIS — E1165 Type 2 diabetes mellitus with hyperglycemia: Secondary | ICD-10-CM | POA: Diagnosis not present

## 2020-05-25 DIAGNOSIS — E782 Mixed hyperlipidemia: Secondary | ICD-10-CM | POA: Diagnosis not present

## 2020-05-25 DIAGNOSIS — Z23 Encounter for immunization: Secondary | ICD-10-CM | POA: Diagnosis not present

## 2020-05-25 DIAGNOSIS — E1165 Type 2 diabetes mellitus with hyperglycemia: Secondary | ICD-10-CM | POA: Diagnosis not present

## 2020-05-25 DIAGNOSIS — I10 Essential (primary) hypertension: Secondary | ICD-10-CM | POA: Diagnosis not present

## 2020-05-25 DIAGNOSIS — Z7185 Encounter for immunization safety counseling: Secondary | ICD-10-CM | POA: Diagnosis not present

## 2020-05-25 DIAGNOSIS — E559 Vitamin D deficiency, unspecified: Secondary | ICD-10-CM | POA: Diagnosis not present

## 2020-06-22 DIAGNOSIS — Z0000 Encounter for general adult medical examination without abnormal findings: Secondary | ICD-10-CM | POA: Diagnosis not present

## 2020-06-22 DIAGNOSIS — Z1339 Encounter for screening examination for other mental health and behavioral disorders: Secondary | ICD-10-CM | POA: Diagnosis not present

## 2020-06-22 DIAGNOSIS — Z1331 Encounter for screening for depression: Secondary | ICD-10-CM | POA: Diagnosis not present

## 2020-07-18 ENCOUNTER — Other Ambulatory Visit: Payer: Self-pay | Admitting: Family Medicine

## 2020-07-18 DIAGNOSIS — E2839 Other primary ovarian failure: Secondary | ICD-10-CM

## 2020-07-18 DIAGNOSIS — Z1231 Encounter for screening mammogram for malignant neoplasm of breast: Secondary | ICD-10-CM

## 2020-09-05 DIAGNOSIS — E663 Overweight: Secondary | ICD-10-CM | POA: Diagnosis not present

## 2020-09-05 DIAGNOSIS — L409 Psoriasis, unspecified: Secondary | ICD-10-CM | POA: Diagnosis not present

## 2020-09-05 DIAGNOSIS — I152 Hypertension secondary to endocrine disorders: Secondary | ICD-10-CM | POA: Diagnosis not present

## 2020-09-05 DIAGNOSIS — B070 Plantar wart: Secondary | ICD-10-CM | POA: Diagnosis not present

## 2020-09-05 DIAGNOSIS — I10 Essential (primary) hypertension: Secondary | ICD-10-CM | POA: Diagnosis not present

## 2020-09-05 DIAGNOSIS — E1165 Type 2 diabetes mellitus with hyperglycemia: Secondary | ICD-10-CM | POA: Diagnosis not present

## 2020-09-19 DIAGNOSIS — H2513 Age-related nuclear cataract, bilateral: Secondary | ICD-10-CM | POA: Diagnosis not present

## 2020-09-19 DIAGNOSIS — H524 Presbyopia: Secondary | ICD-10-CM | POA: Diagnosis not present

## 2020-09-19 DIAGNOSIS — E119 Type 2 diabetes mellitus without complications: Secondary | ICD-10-CM | POA: Diagnosis not present

## 2020-12-06 DIAGNOSIS — E782 Mixed hyperlipidemia: Secondary | ICD-10-CM | POA: Diagnosis not present

## 2020-12-06 DIAGNOSIS — Z23 Encounter for immunization: Secondary | ICD-10-CM | POA: Diagnosis not present

## 2020-12-06 DIAGNOSIS — L409 Psoriasis, unspecified: Secondary | ICD-10-CM | POA: Diagnosis not present

## 2020-12-06 DIAGNOSIS — Z7185 Encounter for immunization safety counseling: Secondary | ICD-10-CM | POA: Diagnosis not present

## 2020-12-06 DIAGNOSIS — I10 Essential (primary) hypertension: Secondary | ICD-10-CM | POA: Diagnosis not present

## 2020-12-06 DIAGNOSIS — E1165 Type 2 diabetes mellitus with hyperglycemia: Secondary | ICD-10-CM | POA: Diagnosis not present

## 2020-12-26 DIAGNOSIS — L300 Nummular dermatitis: Secondary | ICD-10-CM | POA: Diagnosis not present

## 2020-12-26 DIAGNOSIS — L820 Inflamed seborrheic keratosis: Secondary | ICD-10-CM | POA: Diagnosis not present

## 2020-12-26 DIAGNOSIS — L821 Other seborrheic keratosis: Secondary | ICD-10-CM | POA: Diagnosis not present

## 2021-02-20 DIAGNOSIS — D1801 Hemangioma of skin and subcutaneous tissue: Secondary | ICD-10-CM | POA: Diagnosis not present

## 2021-02-20 DIAGNOSIS — L720 Epidermal cyst: Secondary | ICD-10-CM | POA: Diagnosis not present

## 2021-02-20 DIAGNOSIS — I8312 Varicose veins of left lower extremity with inflammation: Secondary | ICD-10-CM | POA: Diagnosis not present

## 2021-02-20 DIAGNOSIS — D225 Melanocytic nevi of trunk: Secondary | ICD-10-CM | POA: Diagnosis not present

## 2021-02-20 DIAGNOSIS — I872 Venous insufficiency (chronic) (peripheral): Secondary | ICD-10-CM | POA: Diagnosis not present

## 2021-02-20 DIAGNOSIS — L400 Psoriasis vulgaris: Secondary | ICD-10-CM | POA: Diagnosis not present

## 2021-02-20 DIAGNOSIS — I8311 Varicose veins of right lower extremity with inflammation: Secondary | ICD-10-CM | POA: Diagnosis not present

## 2021-02-20 DIAGNOSIS — D692 Other nonthrombocytopenic purpura: Secondary | ICD-10-CM | POA: Diagnosis not present

## 2021-02-20 DIAGNOSIS — L821 Other seborrheic keratosis: Secondary | ICD-10-CM | POA: Diagnosis not present

## 2021-03-08 DIAGNOSIS — I10 Essential (primary) hypertension: Secondary | ICD-10-CM | POA: Diagnosis not present

## 2021-03-08 DIAGNOSIS — E669 Obesity, unspecified: Secondary | ICD-10-CM | POA: Diagnosis not present

## 2021-03-08 DIAGNOSIS — R739 Hyperglycemia, unspecified: Secondary | ICD-10-CM | POA: Diagnosis not present

## 2021-03-08 DIAGNOSIS — E1165 Type 2 diabetes mellitus with hyperglycemia: Secondary | ICD-10-CM | POA: Diagnosis not present

## 2021-03-08 DIAGNOSIS — E782 Mixed hyperlipidemia: Secondary | ICD-10-CM | POA: Diagnosis not present

## 2021-03-08 DIAGNOSIS — Z23 Encounter for immunization: Secondary | ICD-10-CM | POA: Diagnosis not present

## 2021-06-14 DIAGNOSIS — Z7185 Encounter for immunization safety counseling: Secondary | ICD-10-CM | POA: Diagnosis not present

## 2021-06-14 DIAGNOSIS — I10 Essential (primary) hypertension: Secondary | ICD-10-CM | POA: Diagnosis not present

## 2021-06-14 DIAGNOSIS — E1165 Type 2 diabetes mellitus with hyperglycemia: Secondary | ICD-10-CM | POA: Diagnosis not present

## 2021-06-14 DIAGNOSIS — E782 Mixed hyperlipidemia: Secondary | ICD-10-CM | POA: Diagnosis not present

## 2021-06-14 DIAGNOSIS — E559 Vitamin D deficiency, unspecified: Secondary | ICD-10-CM | POA: Diagnosis not present

## 2021-07-04 DIAGNOSIS — Z1331 Encounter for screening for depression: Secondary | ICD-10-CM | POA: Diagnosis not present

## 2021-07-04 DIAGNOSIS — Z1339 Encounter for screening examination for other mental health and behavioral disorders: Secondary | ICD-10-CM | POA: Diagnosis not present

## 2021-07-04 DIAGNOSIS — Z23 Encounter for immunization: Secondary | ICD-10-CM | POA: Diagnosis not present

## 2021-07-04 DIAGNOSIS — Z0000 Encounter for general adult medical examination without abnormal findings: Secondary | ICD-10-CM | POA: Diagnosis not present

## 2021-09-21 DIAGNOSIS — H524 Presbyopia: Secondary | ICD-10-CM | POA: Diagnosis not present

## 2021-09-21 DIAGNOSIS — E119 Type 2 diabetes mellitus without complications: Secondary | ICD-10-CM | POA: Diagnosis not present

## 2021-09-21 DIAGNOSIS — H2513 Age-related nuclear cataract, bilateral: Secondary | ICD-10-CM | POA: Diagnosis not present

## 2021-12-13 DIAGNOSIS — E782 Mixed hyperlipidemia: Secondary | ICD-10-CM | POA: Diagnosis not present

## 2021-12-13 DIAGNOSIS — I10 Essential (primary) hypertension: Secondary | ICD-10-CM | POA: Diagnosis not present

## 2021-12-13 DIAGNOSIS — E119 Type 2 diabetes mellitus without complications: Secondary | ICD-10-CM | POA: Diagnosis not present

## 2021-12-17 DIAGNOSIS — I10 Essential (primary) hypertension: Secondary | ICD-10-CM | POA: Diagnosis not present

## 2021-12-17 DIAGNOSIS — E782 Mixed hyperlipidemia: Secondary | ICD-10-CM | POA: Diagnosis not present

## 2021-12-17 DIAGNOSIS — E1165 Type 2 diabetes mellitus with hyperglycemia: Secondary | ICD-10-CM | POA: Diagnosis not present

## 2021-12-17 DIAGNOSIS — Z23 Encounter for immunization: Secondary | ICD-10-CM | POA: Diagnosis not present

## 2022-05-13 DIAGNOSIS — I10 Essential (primary) hypertension: Secondary | ICD-10-CM | POA: Diagnosis not present

## 2022-05-13 DIAGNOSIS — E119 Type 2 diabetes mellitus without complications: Secondary | ICD-10-CM | POA: Diagnosis not present

## 2022-05-13 DIAGNOSIS — E559 Vitamin D deficiency, unspecified: Secondary | ICD-10-CM | POA: Diagnosis not present

## 2022-05-20 DIAGNOSIS — E559 Vitamin D deficiency, unspecified: Secondary | ICD-10-CM | POA: Diagnosis not present

## 2022-05-20 DIAGNOSIS — E1165 Type 2 diabetes mellitus with hyperglycemia: Secondary | ICD-10-CM | POA: Diagnosis not present

## 2022-05-20 DIAGNOSIS — Z23 Encounter for immunization: Secondary | ICD-10-CM | POA: Diagnosis not present

## 2022-05-20 DIAGNOSIS — I10 Essential (primary) hypertension: Secondary | ICD-10-CM | POA: Diagnosis not present

## 2022-09-30 DIAGNOSIS — H2513 Age-related nuclear cataract, bilateral: Secondary | ICD-10-CM | POA: Diagnosis not present

## 2022-09-30 DIAGNOSIS — H524 Presbyopia: Secondary | ICD-10-CM | POA: Diagnosis not present

## 2022-12-10 DIAGNOSIS — E559 Vitamin D deficiency, unspecified: Secondary | ICD-10-CM | POA: Diagnosis not present

## 2022-12-10 DIAGNOSIS — E782 Mixed hyperlipidemia: Secondary | ICD-10-CM | POA: Diagnosis not present

## 2022-12-10 DIAGNOSIS — E1165 Type 2 diabetes mellitus with hyperglycemia: Secondary | ICD-10-CM | POA: Diagnosis not present

## 2022-12-10 DIAGNOSIS — I10 Essential (primary) hypertension: Secondary | ICD-10-CM | POA: Diagnosis not present

## 2022-12-17 DIAGNOSIS — E559 Vitamin D deficiency, unspecified: Secondary | ICD-10-CM | POA: Diagnosis not present

## 2022-12-17 DIAGNOSIS — I10 Essential (primary) hypertension: Secondary | ICD-10-CM | POA: Diagnosis not present

## 2022-12-17 DIAGNOSIS — E1121 Type 2 diabetes mellitus with diabetic nephropathy: Secondary | ICD-10-CM | POA: Diagnosis not present

## 2022-12-17 DIAGNOSIS — E1165 Type 2 diabetes mellitus with hyperglycemia: Secondary | ICD-10-CM | POA: Diagnosis not present

## 2022-12-17 DIAGNOSIS — E782 Mixed hyperlipidemia: Secondary | ICD-10-CM | POA: Diagnosis not present

## 2022-12-17 DIAGNOSIS — E1122 Type 2 diabetes mellitus with diabetic chronic kidney disease: Secondary | ICD-10-CM | POA: Diagnosis not present

## 2022-12-17 DIAGNOSIS — E663 Overweight: Secondary | ICD-10-CM | POA: Diagnosis not present

## 2022-12-17 DIAGNOSIS — I129 Hypertensive chronic kidney disease with stage 1 through stage 4 chronic kidney disease, or unspecified chronic kidney disease: Secondary | ICD-10-CM | POA: Diagnosis not present

## 2023-01-21 DIAGNOSIS — Z1339 Encounter for screening examination for other mental health and behavioral disorders: Secondary | ICD-10-CM | POA: Diagnosis not present

## 2023-01-21 DIAGNOSIS — Z0000 Encounter for general adult medical examination without abnormal findings: Secondary | ICD-10-CM | POA: Diagnosis not present

## 2023-01-21 DIAGNOSIS — Z1331 Encounter for screening for depression: Secondary | ICD-10-CM | POA: Diagnosis not present

## 2023-01-21 DIAGNOSIS — I10 Essential (primary) hypertension: Secondary | ICD-10-CM | POA: Diagnosis not present

## 2023-03-17 DIAGNOSIS — E785 Hyperlipidemia, unspecified: Secondary | ICD-10-CM | POA: Diagnosis not present

## 2023-03-17 DIAGNOSIS — R739 Hyperglycemia, unspecified: Secondary | ICD-10-CM | POA: Diagnosis not present

## 2023-03-17 DIAGNOSIS — I10 Essential (primary) hypertension: Secondary | ICD-10-CM | POA: Diagnosis not present

## 2023-03-17 DIAGNOSIS — R5383 Other fatigue: Secondary | ICD-10-CM | POA: Diagnosis not present

## 2023-03-20 DIAGNOSIS — E1143 Type 2 diabetes mellitus with diabetic autonomic (poly)neuropathy: Secondary | ICD-10-CM | POA: Diagnosis not present

## 2023-03-20 DIAGNOSIS — E1165 Type 2 diabetes mellitus with hyperglycemia: Secondary | ICD-10-CM | POA: Diagnosis not present

## 2023-03-20 DIAGNOSIS — Z23 Encounter for immunization: Secondary | ICD-10-CM | POA: Diagnosis not present

## 2023-03-20 DIAGNOSIS — I10 Essential (primary) hypertension: Secondary | ICD-10-CM | POA: Diagnosis not present

## 2023-03-20 DIAGNOSIS — E782 Mixed hyperlipidemia: Secondary | ICD-10-CM | POA: Diagnosis not present

## 2023-03-20 DIAGNOSIS — E1169 Type 2 diabetes mellitus with other specified complication: Secondary | ICD-10-CM | POA: Diagnosis not present

## 2023-03-20 DIAGNOSIS — Z789 Other specified health status: Secondary | ICD-10-CM | POA: Diagnosis not present

## 2023-03-20 DIAGNOSIS — Z7185 Encounter for immunization safety counseling: Secondary | ICD-10-CM | POA: Diagnosis not present

## 2023-07-18 DIAGNOSIS — E785 Hyperlipidemia, unspecified: Secondary | ICD-10-CM | POA: Diagnosis not present

## 2023-07-18 DIAGNOSIS — R5383 Other fatigue: Secondary | ICD-10-CM | POA: Diagnosis not present

## 2023-07-18 DIAGNOSIS — E559 Vitamin D deficiency, unspecified: Secondary | ICD-10-CM | POA: Diagnosis not present

## 2023-07-22 DIAGNOSIS — I152 Hypertension secondary to endocrine disorders: Secondary | ICD-10-CM | POA: Diagnosis not present

## 2023-07-22 DIAGNOSIS — E1165 Type 2 diabetes mellitus with hyperglycemia: Secondary | ICD-10-CM | POA: Diagnosis not present

## 2023-07-22 DIAGNOSIS — E1122 Type 2 diabetes mellitus with diabetic chronic kidney disease: Secondary | ICD-10-CM | POA: Diagnosis not present

## 2023-07-22 DIAGNOSIS — E1169 Type 2 diabetes mellitus with other specified complication: Secondary | ICD-10-CM | POA: Diagnosis not present

## 2023-07-22 DIAGNOSIS — E1159 Type 2 diabetes mellitus with other circulatory complications: Secondary | ICD-10-CM | POA: Diagnosis not present

## 2023-07-22 DIAGNOSIS — E559 Vitamin D deficiency, unspecified: Secondary | ICD-10-CM | POA: Diagnosis not present

## 2023-07-22 DIAGNOSIS — I10 Essential (primary) hypertension: Secondary | ICD-10-CM | POA: Diagnosis not present

## 2023-07-22 DIAGNOSIS — I129 Hypertensive chronic kidney disease with stage 1 through stage 4 chronic kidney disease, or unspecified chronic kidney disease: Secondary | ICD-10-CM | POA: Diagnosis not present

## 2023-07-22 DIAGNOSIS — Z789 Other specified health status: Secondary | ICD-10-CM | POA: Diagnosis not present

## 2023-07-22 DIAGNOSIS — E782 Mixed hyperlipidemia: Secondary | ICD-10-CM | POA: Diagnosis not present

## 2023-09-28 ENCOUNTER — Other Ambulatory Visit: Payer: Self-pay

## 2023-09-28 ENCOUNTER — Encounter (HOSPITAL_COMMUNITY): Payer: Self-pay

## 2023-09-28 ENCOUNTER — Emergency Department (HOSPITAL_COMMUNITY)
Admission: EM | Admit: 2023-09-28 | Discharge: 2023-09-28 | Disposition: A | Discharge status: Home / Self Care | Attending: Emergency Medicine | Admitting: Emergency Medicine

## 2023-09-28 DIAGNOSIS — E119 Type 2 diabetes mellitus without complications: Secondary | ICD-10-CM | POA: Diagnosis not present

## 2023-09-28 DIAGNOSIS — Z7982 Long term (current) use of aspirin: Secondary | ICD-10-CM | POA: Insufficient documentation

## 2023-09-28 DIAGNOSIS — R220 Localized swelling, mass and lump, head: Secondary | ICD-10-CM | POA: Diagnosis present

## 2023-09-28 DIAGNOSIS — I10 Essential (primary) hypertension: Secondary | ICD-10-CM | POA: Insufficient documentation

## 2023-09-28 DIAGNOSIS — L03211 Cellulitis of face: Secondary | ICD-10-CM | POA: Diagnosis not present

## 2023-09-28 DIAGNOSIS — Z7984 Long term (current) use of oral hypoglycemic drugs: Secondary | ICD-10-CM | POA: Diagnosis not present

## 2023-09-28 DIAGNOSIS — Z79899 Other long term (current) drug therapy: Secondary | ICD-10-CM | POA: Insufficient documentation

## 2023-09-28 LAB — CBC WITH DIFFERENTIAL/PLATELET
Abs Immature Granulocytes: 0.01 10*3/uL (ref 0.00–0.07)
Basophils Absolute: 0.1 10*3/uL (ref 0.0–0.1)
Basophils Relative: 1 %
Eosinophils Absolute: 0.1 10*3/uL (ref 0.0–0.5)
Eosinophils Relative: 1 %
HCT: 48.9 % — ABNORMAL HIGH (ref 36.0–46.0)
Hemoglobin: 16.3 g/dL — ABNORMAL HIGH (ref 12.0–15.0)
Immature Granulocytes: 0 %
Lymphocytes Relative: 18 %
Lymphs Abs: 1.5 10*3/uL (ref 0.7–4.0)
MCH: 31.8 pg (ref 26.0–34.0)
MCHC: 33.3 g/dL (ref 30.0–36.0)
MCV: 95.5 fL (ref 80.0–100.0)
Monocytes Absolute: 0.5 10*3/uL (ref 0.1–1.0)
Monocytes Relative: 6 %
Neutro Abs: 6.3 10*3/uL (ref 1.7–7.7)
Neutrophils Relative %: 74 %
Platelets: 259 10*3/uL (ref 150–400)
RBC: 5.12 MIL/uL — ABNORMAL HIGH (ref 3.87–5.11)
RDW: 12.5 % (ref 11.5–15.5)
WBC: 8.3 10*3/uL (ref 4.0–10.5)
nRBC: 0 % (ref 0.0–0.2)

## 2023-09-28 LAB — COMPREHENSIVE METABOLIC PANEL
ALT: 24 U/L (ref 0–44)
AST: 22 U/L (ref 15–41)
Albumin: 4.6 g/dL (ref 3.5–5.0)
Alkaline Phosphatase: 45 U/L (ref 38–126)
Anion gap: 13 (ref 5–15)
BUN: 14 mg/dL (ref 8–23)
CO2: 25 mmol/L (ref 22–32)
Calcium: 9.7 mg/dL (ref 8.9–10.3)
Chloride: 98 mmol/L (ref 98–111)
Creatinine, Ser: 0.54 mg/dL (ref 0.44–1.00)
GFR, Estimated: >60 mL/min (ref >60)
Glucose, Bld: 156 mg/dL — ABNORMAL HIGH (ref 70–99)
Potassium: 3.7 mmol/L (ref 3.5–5.1)
Sodium: 136 mmol/L (ref 135–145)
Total Bilirubin: 0.6 mg/dL (ref 0.0–1.2)
Total Protein: 8.9 g/dL — ABNORMAL HIGH (ref 6.5–8.1)

## 2023-09-28 MED ORDER — LORATADINE 10 MG PO TABS: 10.0000 mg | ORAL_TABLET | Freq: Every day | ORAL | 0 refills | Status: AC | PRN

## 2023-09-28 MED ORDER — FAMOTIDINE IN NACL 20-0.9 MG/50ML-% IV SOLN
20.0000 mg | Freq: Once | INTRAVENOUS | Status: AC
  Administered 2023-09-28: 20 mg via INTRAVENOUS
  Filled 2023-09-28: qty 50

## 2023-09-28 MED ORDER — DIPHENHYDRAMINE HCL 50 MG/ML IJ SOLN
25.0000 mg | Freq: Once | INTRAMUSCULAR | Status: AC
  Administered 2023-09-28: 25 mg via INTRAVENOUS
  Filled 2023-09-28: qty 1

## 2023-09-28 MED ORDER — METFORMIN HCL 500 MG PO TABS
1000.0000 mg | ORAL_TABLET | Freq: Once | ORAL | Status: AC
  Administered 2023-09-28: 1000 mg via ORAL
  Filled 2023-09-28: qty 2

## 2023-09-28 MED ORDER — METHYLPREDNISOLONE SODIUM SUCC 125 MG IJ SOLR
125.0000 mg | Freq: Once | INTRAMUSCULAR | Status: AC
  Administered 2023-09-28: 125 mg via INTRAVENOUS
  Filled 2023-09-28: qty 2

## 2023-09-28 MED ORDER — AMOXICILLIN 500 MG PO CAPS: 500.0000 mg | ORAL_CAPSULE | Freq: Three times a day (TID) | ORAL | 0 refills | Status: AC

## 2023-09-28 NOTE — ED Triage Notes (Signed)
 Pt has an area from her right temporal area around the scalp of the right ear that is swollen and itching since yesterday.

## 2023-09-28 NOTE — ED Provider Notes (Signed)
 [place] EMERGENCY DEPARTMENT AT [facility] Provider Note   CSN: [id number] Arrival date & time: 09/28/23  1344     History  Chief Complaint  Patient presents with   swelling    Cathy Palmer is a 78 y.o. female.  Pt is a 78 yo female with pmhx significant for htn, dm, and svt.  Pt developed an area on the right side of her face that is itching and swelling.  She denies any new products.  She has taken nothing for it.  She is worried it's cellulitis.  No fevers.  No trouble swallowing.  No trouble breathing.  No sinus issues.       Home Medications Prior to Admission medications   Medication Sig Start Date End Date Taking? Authorizing Provider  amoxicillin (AMOXIL) 500 MG capsule Take 1 capsule (500 mg total) by mouth 3 (three) times daily. 09/28/23  Yes Jacalyn Lefevre, MD  loratadine (CLARITIN) 10 MG tablet Take 1 tablet (10 mg total) by mouth daily as needed for itching. 09/28/23  Yes Jacalyn Lefevre, MD  amLODipine (NORVASC) 5 MG tablet Take 5 mg by mouth daily.    [provider]  aspirin 81 MG chewable tablet Chew 81 mg by mouth daily.    [provider]  calcium-vitamin D (OSCAL WITH D) 500-200 MG-UNIT per tablet Take 1 tablet by mouth daily.    [provider]  Coenzyme Q10 (CO Q 10 PO) Take 1 tablet by mouth daily.    [provider]  hydrochlorothiazide (MICROZIDE) 12.5 MG capsule Take 12.5 mg by mouth daily.    [provider]  losartan (COZAAR) 100 MG tablet Take 100 mg by mouth daily.    [provider]  metFORMIN (GLUCOPHAGE) 500 MG tablet Take 500 mg by mouth 2 (two) times daily with a meal.    [provider]  metoprolol succinate (TOPROL-XL) 25 MG 24 hr tablet Take 50 mg by mouth 2 (two) times daily. Take 2 tablets twice daily    [provider]      Allergies    Bee venom    Review of Systems   Review of Systems  HENT:  Positive for facial swelling.   All other systems  reviewed and are negative.   Physical Exam Updated Vital Signs BP (!) 169/73   Pulse 70   Temp 98 F (36.7 C)   Resp 16   Ht 5' (1.524 m)   Wt 61.2 kg   SpO2 99%   BMI 26.37 kg/m  Physical Exam Vitals and nursing note reviewed.  Constitutional:      Appearance: Normal appearance.  HENT:     Head: Atraumatic.      Comments: Swelling noted anterior and posterior to right ear.    Right Ear: External ear normal.     Left Ear: External ear normal.     Nose: Nose normal.     Mouth/Throat:     Mouth: Mucous membranes are moist.     Pharynx: Oropharynx is clear.  Cardiovascular:     Rate and Rhythm: Normal rate and regular rhythm.     Pulses: Normal pulses.     Heart sounds: Normal heart sounds.  Pulmonary:     Effort: Pulmonary effort is normal.     Breath sounds: Normal breath sounds.  Musculoskeletal:        General: Normal range of motion.     Cervical back: Normal range of motion and neck supple.  Skin:  General: Skin is warm.     Capillary Refill: Capillary refill takes less than 2 seconds.  Neurological:     General: No focal deficit present.     Mental Status: She is alert and oriented to person, place, and time.     ED Results / Procedures / Treatments   Labs (all labs ordered are listed, but only abnormal results are displayed) Labs Reviewed  COMPREHENSIVE METABOLIC PANEL - Abnormal; Notable for the following components:      Result Value   Glucose, Bld 156 (*)    Total Protein 8.9 (*)    All other components within normal limits  CBC WITH DIFFERENTIAL/PLATELET - Abnormal; Notable for the following components:   RBC 5.12 (*)    Hemoglobin 16.3 (*)    HCT 48.9 (*)    All other components within normal limits    EKG None  Radiology No results found.  Procedures Procedures    Medications Ordered in ED Medications  methylPREDNISolone sodium succinate (SOLU-MEDROL) 125 mg/2 mL injection 125 mg (125 mg Intravenous Given 09/28/23 1556)   diphenhydrAMINE (BENADRYL) injection 25 mg (25 mg Intravenous Given 09/28/23 1557)  famotidine (PEPCID) IVPB 20 mg premix (0 mg Intravenous Stopped 09/28/23 1724)  metFORMIN (GLUCOPHAGE) tablet 1,000 mg (1,000 mg Oral Given 09/28/23 1600)    ED Course/ Medical Decision Making/ A&P                                 Medical Decision Making Amount and/or Complexity of Data Reviewed Labs: ordered.  Risk OTC drugs. Prescription drug management.   This patient presents to the ED for concern of swelling, this involves an extensive number of treatment options, and is a complaint that carries with it a high risk of complications and morbidity.  The differential diagnosis includes cellulitis, lymphangitis, urticaria,   Co morbidities that complicate the patient evaluation  htn, dm, and svt   Additional history obtained:  Additional history obtained from epic chart review External records from outside source obtained and reviewed including husband   Lab Tests:  I Ordered, and personally interpreted labs.  The pertinent results include:  cbc nl, cmp nl other than glucose 156  Medicines ordered and prescription drug management:  I ordered medication including solumedrol/pepcid/benadryl  for sx  Reevaluation of the patient after these medicines showed that the patient improved I have reviewed the patients home medicines and have made adjustments as needed  Problem List / ED Course:  Swelling/redness:  cellulitis vs allergic rxn. Pt is feeling better after meds, but redness is still there.  Pt started on abx.  She is stable for d/c.  Return if worse.  F/u with pcp.   Reevaluation:  After the interventions noted above, I reevaluated the patient and found that they have :improved   Social Determinants of Health:  Lives at home   Dispostion:  After consideration of the diagnostic results and the patients response to treatment, I feel that the patent would benefit from discharge  with outpatient f/u.          Final Clinical Impression(s) / ED Diagnoses Final diagnoses:  Cellulitis of face    Rx / DC Orders ED Discharge Orders          Ordered    amoxicillin (AMOXIL) 500 MG capsule  3 times daily        09/28/23 1711    loratadine (CLARITIN) 10 MG tablet  Daily PRN        09/28/23 1711              Jacalyn Lefevre, MD 09/28/23 2350

## 2023-10-13 DIAGNOSIS — R739 Hyperglycemia, unspecified: Secondary | ICD-10-CM | POA: Diagnosis not present

## 2023-10-13 DIAGNOSIS — R5383 Other fatigue: Secondary | ICD-10-CM | POA: Diagnosis not present

## 2023-10-13 DIAGNOSIS — E860 Dehydration: Secondary | ICD-10-CM | POA: Diagnosis not present

## 2023-10-13 DIAGNOSIS — E559 Vitamin D deficiency, unspecified: Secondary | ICD-10-CM | POA: Diagnosis not present

## 2023-10-13 DIAGNOSIS — R110 Nausea: Secondary | ICD-10-CM | POA: Diagnosis not present

## 2023-10-13 DIAGNOSIS — L03211 Cellulitis of face: Secondary | ICD-10-CM | POA: Diagnosis not present

## 2023-10-20 DIAGNOSIS — E559 Vitamin D deficiency, unspecified: Secondary | ICD-10-CM | POA: Diagnosis not present

## 2023-10-20 DIAGNOSIS — E1165 Type 2 diabetes mellitus with hyperglycemia: Secondary | ICD-10-CM | POA: Diagnosis not present

## 2023-10-20 DIAGNOSIS — I10 Essential (primary) hypertension: Secondary | ICD-10-CM | POA: Diagnosis not present

## 2023-10-20 DIAGNOSIS — Z789 Other specified health status: Secondary | ICD-10-CM | POA: Diagnosis not present

## 2023-10-20 DIAGNOSIS — R110 Nausea: Secondary | ICD-10-CM | POA: Diagnosis not present

## 2023-10-20 DIAGNOSIS — E782 Mixed hyperlipidemia: Secondary | ICD-10-CM | POA: Diagnosis not present

## 2023-10-23 DIAGNOSIS — K8020 Calculus of gallbladder without cholecystitis without obstruction: Secondary | ICD-10-CM | POA: Diagnosis not present

## 2023-10-27 DIAGNOSIS — I129 Hypertensive chronic kidney disease with stage 1 through stage 4 chronic kidney disease, or unspecified chronic kidney disease: Secondary | ICD-10-CM | POA: Diagnosis not present

## 2023-10-27 DIAGNOSIS — E1122 Type 2 diabetes mellitus with diabetic chronic kidney disease: Secondary | ICD-10-CM | POA: Diagnosis not present

## 2023-10-27 DIAGNOSIS — E860 Dehydration: Secondary | ICD-10-CM | POA: Diagnosis not present

## 2023-10-27 DIAGNOSIS — I10 Essential (primary) hypertension: Secondary | ICD-10-CM | POA: Diagnosis not present

## 2023-10-27 DIAGNOSIS — E1165 Type 2 diabetes mellitus with hyperglycemia: Secondary | ICD-10-CM | POA: Diagnosis not present

## 2023-10-27 DIAGNOSIS — K8020 Calculus of gallbladder without cholecystitis without obstruction: Secondary | ICD-10-CM | POA: Diagnosis not present

## 2023-11-25 DIAGNOSIS — H524 Presbyopia: Secondary | ICD-10-CM | POA: Diagnosis not present

## 2023-11-25 DIAGNOSIS — H2513 Age-related nuclear cataract, bilateral: Secondary | ICD-10-CM | POA: Diagnosis not present

## 2023-11-25 DIAGNOSIS — E119 Type 2 diabetes mellitus without complications: Secondary | ICD-10-CM | POA: Diagnosis not present

## 2024-01-21 DIAGNOSIS — I10 Essential (primary) hypertension: Secondary | ICD-10-CM | POA: Diagnosis not present

## 2024-01-21 DIAGNOSIS — R739 Hyperglycemia, unspecified: Secondary | ICD-10-CM | POA: Diagnosis not present

## 2024-01-21 DIAGNOSIS — R5383 Other fatigue: Secondary | ICD-10-CM | POA: Diagnosis not present

## 2024-01-27 DIAGNOSIS — E1165 Type 2 diabetes mellitus with hyperglycemia: Secondary | ICD-10-CM | POA: Diagnosis not present

## 2024-01-27 DIAGNOSIS — I10 Essential (primary) hypertension: Secondary | ICD-10-CM | POA: Diagnosis not present

## 2024-01-27 DIAGNOSIS — R000 Tachycardia, unspecified: Secondary | ICD-10-CM | POA: Diagnosis not present

## 2024-01-27 DIAGNOSIS — E11319 Type 2 diabetes mellitus with unspecified diabetic retinopathy without macular edema: Secondary | ICD-10-CM | POA: Diagnosis not present

## 2024-01-27 DIAGNOSIS — E1169 Type 2 diabetes mellitus with other specified complication: Secondary | ICD-10-CM | POA: Diagnosis not present

## 2024-01-27 DIAGNOSIS — E782 Mixed hyperlipidemia: Secondary | ICD-10-CM | POA: Diagnosis not present

## 2024-01-27 DIAGNOSIS — Z7984 Long term (current) use of oral hypoglycemic drugs: Secondary | ICD-10-CM | POA: Diagnosis not present

## 2024-02-03 DIAGNOSIS — Z1339 Encounter for screening examination for other mental health and behavioral disorders: Secondary | ICD-10-CM | POA: Diagnosis not present

## 2024-02-03 DIAGNOSIS — Z1331 Encounter for screening for depression: Secondary | ICD-10-CM | POA: Diagnosis not present

## 2024-02-03 DIAGNOSIS — Z0000 Encounter for general adult medical examination without abnormal findings: Secondary | ICD-10-CM | POA: Diagnosis not present

## 2024-04-21 DIAGNOSIS — E1169 Type 2 diabetes mellitus with other specified complication: Secondary | ICD-10-CM | POA: Diagnosis not present

## 2024-04-21 DIAGNOSIS — E782 Mixed hyperlipidemia: Secondary | ICD-10-CM | POA: Diagnosis not present

## 2024-04-21 DIAGNOSIS — I10 Essential (primary) hypertension: Secondary | ICD-10-CM | POA: Diagnosis not present

## 2024-04-27 DIAGNOSIS — E1121 Type 2 diabetes mellitus with diabetic nephropathy: Secondary | ICD-10-CM | POA: Diagnosis not present

## 2024-04-27 DIAGNOSIS — Z7185 Encounter for immunization safety counseling: Secondary | ICD-10-CM | POA: Diagnosis not present

## 2024-04-27 DIAGNOSIS — Z7984 Long term (current) use of oral hypoglycemic drugs: Secondary | ICD-10-CM | POA: Diagnosis not present

## 2024-04-27 DIAGNOSIS — E1165 Type 2 diabetes mellitus with hyperglycemia: Secondary | ICD-10-CM | POA: Diagnosis not present

## 2024-04-27 DIAGNOSIS — I10 Essential (primary) hypertension: Secondary | ICD-10-CM | POA: Diagnosis not present

## 2024-04-27 DIAGNOSIS — Z79899 Other long term (current) drug therapy: Secondary | ICD-10-CM | POA: Diagnosis not present

## 2024-04-27 DIAGNOSIS — E1159 Type 2 diabetes mellitus with other circulatory complications: Secondary | ICD-10-CM | POA: Diagnosis not present

## 2024-04-27 DIAGNOSIS — Z23 Encounter for immunization: Secondary | ICD-10-CM | POA: Diagnosis not present

## 2024-04-27 DIAGNOSIS — R000 Tachycardia, unspecified: Secondary | ICD-10-CM | POA: Diagnosis not present

## 2024-04-27 DIAGNOSIS — I152 Hypertension secondary to endocrine disorders: Secondary | ICD-10-CM | POA: Diagnosis not present
